# Patient Record
Sex: Female | Born: 1960 | Race: White | Hispanic: No | Marital: Single | Smoking: Never smoker
Health system: Southern US, Community
[De-identification: ages and names within clinical notes are randomized; demographics above are authoritative.]

## PROBLEM LIST (undated history)

## (undated) DIAGNOSIS — K5790 Diverticulosis of intestine, part unspecified, without perforation or abscess without bleeding: Secondary | ICD-10-CM

## (undated) DIAGNOSIS — Z8619 Personal history of other infectious and parasitic diseases: Secondary | ICD-10-CM

## (undated) DIAGNOSIS — K219 Gastro-esophageal reflux disease without esophagitis: Secondary | ICD-10-CM

## (undated) DIAGNOSIS — Z789 Other specified health status: Secondary | ICD-10-CM

## (undated) DIAGNOSIS — T7840XA Allergy, unspecified, initial encounter: Secondary | ICD-10-CM

## (undated) DIAGNOSIS — E079 Disorder of thyroid, unspecified: Secondary | ICD-10-CM

## (undated) DIAGNOSIS — D219 Benign neoplasm of connective and other soft tissue, unspecified: Secondary | ICD-10-CM

## (undated) HISTORY — DX: Benign neoplasm of connective and other soft tissue, unspecified: D21.9

## (undated) HISTORY — DX: Other specified health status: Z78.9

## (undated) HISTORY — DX: Allergy, unspecified, initial encounter: T78.40XA

## (undated) HISTORY — DX: Disorder of thyroid, unspecified: E07.9

## (undated) HISTORY — DX: Diverticulosis of intestine, part unspecified, without perforation or abscess without bleeding: K57.90

## (undated) HISTORY — DX: Personal history of other infectious and parasitic diseases: Z86.19

---

## 2005-08-19 ENCOUNTER — Ambulatory Visit: Payer: Self-pay | Admitting: Internal Medicine

## 2005-08-30 ENCOUNTER — Ambulatory Visit: Payer: Self-pay | Admitting: Internal Medicine

## 2012-07-08 ENCOUNTER — Telehealth: Payer: Self-pay | Admitting: Internal Medicine

## 2012-07-08 NOTE — Telephone Encounter (Signed)
Left message for pt to call back  °

## 2012-07-08 NOTE — Telephone Encounter (Signed)
Pt had CT scan and office notes from Dr. Conley Rolls in [facility] sent for Dr. Marina Goodell to review. Pt is requesting a direct EGD but states she is having problems with chronic abdominal pain and reflux. Records placed on Dr. Lamar Sprinkles office for review. Dr. Marina Goodell will you agree to see this pt? Please advise.

## 2012-07-09 NOTE — Telephone Encounter (Signed)
Need to have her see an extender to assess her problems/complaints,and review outside records, and decide if EGD appropriate

## 2012-07-09 NOTE — Telephone Encounter (Signed)
Pt aware. States she will call back tomorrow to schedule appt once she looks at her work schedule.

## 2012-07-10 NOTE — Telephone Encounter (Signed)
Pt states she had to work last night and she will call back Monday to schedule.

## 2012-07-13 ENCOUNTER — Telehealth: Payer: Self-pay | Admitting: Internal Medicine

## 2012-07-13 NOTE — Telephone Encounter (Signed)
Pt wants appt with [facility] later next week. Will call back Wed to see if schedule is out.

## 2012-07-13 NOTE — Telephone Encounter (Signed)
Forward  6 pages from [facility] to Dr. Yancey Flemings for review on 07-13-12 ym

## 2012-07-17 NOTE — Telephone Encounter (Signed)
Scheduled pt to see Amy Esterwood PA 07/22/12@3 :30pm. Pt aware of appt date and time.

## 2012-07-22 ENCOUNTER — Encounter: Payer: Self-pay | Admitting: Physician Assistant

## 2012-07-22 ENCOUNTER — Ambulatory Visit (INDEPENDENT_AMBULATORY_CARE_PROVIDER_SITE_OTHER): Payer: Commercial Managed Care - PPO | Admitting: Physician Assistant

## 2012-07-22 VITALS — BP 140/84 | HR 82 | Ht 67.0 in | Wt 162.4 lb

## 2012-07-22 DIAGNOSIS — K5790 Diverticulosis of intestine, part unspecified, without perforation or abscess without bleeding: Secondary | ICD-10-CM

## 2012-07-22 DIAGNOSIS — K5730 Diverticulosis of large intestine without perforation or abscess without bleeding: Secondary | ICD-10-CM

## 2012-07-22 DIAGNOSIS — R1013 Epigastric pain: Secondary | ICD-10-CM

## 2012-07-22 DIAGNOSIS — K219 Gastro-esophageal reflux disease without esophagitis: Secondary | ICD-10-CM

## 2012-07-22 NOTE — Patient Instructions (Addendum)
Stay on Prilosec, one tab 30 min before breakfast. You have been scheduled for an endoscopy with propofol. Please follow written instructions given to you at your visit today. If you use inhalers (even only as needed) or a CPAP machine, please bring them with you on the day of your procedure.

## 2012-07-22 NOTE — Progress Notes (Signed)
Subjective:    Patient ID: Autumn Wall, female    DOB: June 25, 1961, 51 y.o.   MRN: [id number]  HPI Autumn Wall is a pleasant 51 year old white female known remotely to Dr. Marina Goodell from prior colonoscopy this was done in January of 2007 and showed mild left-sided diverticulosis and external hemorrhoids. She comes in today with complaints of epigastric and left upper quadrant pain which has been present over the past year . She was seen by a GI group in [place] ( Dr. Conley Rolls) in August of 2013 with the same complaint, and underwent CT scan of the abdomen and pelvis which was negative she had stool throughout the visualized colon and a small hiatal hernia. She was to go back for upper endoscopy but has not had that done. She has been on Prilosec 20 mg by mouth daily over the past several years for what she describes as reflux and heartburn. She says she does well as long as she stays on the Prilosec but her symptoms always return if she stops it. She is no complaint of dysphagia or odynophagia she has not been taking any regular aspirin or NSAIDs. Her appetite has been good her weight has been stable she has not noticed any changes in her bowel habits nor any melena or hematochezia. She did feel that her pain has been aggravated by caffeine which she has decreased significantly and also at times by stress. She describes it as a dull ache which is annoying and fairly constant ,sometimes aggravated by  mouth intake. She says she has had baseline labs done through her primary care provider within the past several months and everything was normal .    Review of Systems  Constitutional: Negative.   HENT: Negative.   Respiratory: Negative.   Cardiovascular: Negative.   Gastrointestinal: Positive for abdominal pain.  Genitourinary: Negative.   Musculoskeletal: Negative.   Neurological: Negative.   Hematological: Negative.   Psychiatric/Behavioral: Negative.    Outpatient Encounter Prescriptions as of  07/22/2012  Medication Sig Dispense Refill  . Bioflavonoid Products (VITAMIN C) CHEW Chew 1 tablet by mouth daily.      . Multiple Vitamin (MULTIVITAMIN) tablet Take 1 tablet by mouth daily.      Marland Kitchen omeprazole (PRILOSEC) 20 MG capsule Take 20 mg by mouth daily.        NKDA Patient Active Problem List  Diagnosis  . GERD (gastroesophageal reflux disease)  . Diverticulosis   History  Substance Use Topics  . Smoking status: Never Smoker   . Smokeless tobacco: Never Used  . Alcohol Use: Not on file     Comment: rarely   Family History  Problem Relation Age of Onset  . Heart disease Mother   . Brain cancer Mother     tumor  . Atrial fibrillation Father      Objective:   Physical Exam well-developed white female in no acute distress, pleasant blood pressure 140/84 pulse 82 height 5 foot 7 weight 162. HEENT; nontraumatic normocephalic EOMI PERRLA sclera anicteric, Neck; supple no JVD, Cardiovascular; regular rate and rhythm with S1-S2 no murmur or gallop, Pulmonary; clear bilaterally, Abdomen; soft basically nontender nondistended there is no palpable mass or hepatosplenomegaly no chest wall or costal margin tenderness, bowel sounds are active, Rectal ;not done, Extremities; no clubbing cyanosis or edema skin warm and dry, Psych; mood and affect normal and appropriate        Assessment & Plan:  #52 51 year old female with chronic GERD and complaints of epigastric and  left upper quadrant pain x1 year persistent but not progressive negative CT scan August 2013. Will need to rule out gastritis ,peptic ulcer disease, H. Pylori, nonulcer dyspepsia.  #2 diverticulosis  Plan; we'll obtain copies of her labs from her primary care doctor Continue Prilosec 20 mg by mouth daily for now Pedal for upper endoscopy with Dr. Garner Gavel was discussed in detail with the patient and she is agreeable to proceed

## 2012-07-23 NOTE — Progress Notes (Signed)
Agree with initial assessment and plans 

## 2012-08-24 ENCOUNTER — Ambulatory Visit (AMBULATORY_SURGERY_CENTER): Payer: Commercial Managed Care - PPO | Admitting: Internal Medicine

## 2012-08-24 ENCOUNTER — Encounter: Payer: Self-pay | Admitting: Internal Medicine

## 2012-08-24 VITALS — BP 134/92 | HR 73 | Temp 97.9°F | Resp 14 | Ht 67.0 in | Wt 162.0 lb

## 2012-08-24 DIAGNOSIS — R1013 Epigastric pain: Secondary | ICD-10-CM

## 2012-08-24 DIAGNOSIS — K219 Gastro-esophageal reflux disease without esophagitis: Secondary | ICD-10-CM

## 2012-08-24 MED ORDER — ESOMEPRAZOLE MAGNESIUM 40 MG PO CPDR: 40.0000 mg | DELAYED_RELEASE_CAPSULE | Freq: Every day | ORAL | Status: DC

## 2012-08-24 MED ORDER — SODIUM CHLORIDE 0.9 % IV SOLN: 500.0000 mL | INTRAVENOUS | Status: DC

## 2012-08-24 NOTE — Op Note (Signed)
[facility] [address] [place], [address]   ENDOSCOPY PROCEDURE REPORT  PATIENT: Shauna, Bodkins.  MR#: [id number] BIRTHDATE: [birth year] , 51  yrs. old GENDER: Female ENDOSCOPIST: Roxy Cedar, MD REFERRED BY:  .  Self / Office PROCEDURE DATE:  08/24/2012 PROCEDURE:  EGD, diagnostic ASA CLASS:     Class I INDICATIONS:  abdominal pain in upper left quadrant / epigastric. Greater than one year off and on. MEDICATIONS: MAC sedation, administered by CRNA and propofol (Diprivan) 200mg  IV TOPICAL ANESTHETIC: Cetacaine Spray  DESCRIPTION OF PROCEDURE: After the risks benefits and alternatives of the procedure were thoroughly explained, informed consent was obtained.  The LB GIF-H180 [id number] endoscope was introduced through the mouth and advanced to the third portion of the duodenum. Without limitations.  The instrument was slowly withdrawn as the mucosa was fully examined.      The upper, middle and distal third of the esophagus were carefully inspected and no abnormalities were noted.  The z-line was well seen at the GEJ.  The endoscope was pushed into the fundus which was normal including a retroflexed view.  The antrum, gastric body, first and second part of the duodenum were unremarkable. Retroflexed views revealed no abnormalities.     The scope was then withdrawn from the patient and the procedure completed.  COMPLICATIONS: There were no complications. ENDOSCOPIC IMPRESSION: 1. Normal EGD 2. GERD 3. No GI cause for chronic upper abdominal pain found  RECOMMENDATIONS: 1.  Anti-reflux regimen to be followed 2.  Continue PPI (omeprazole) for GERD  REPEAT EXAM:  eSigned:  Roxy Cedar, MD 08/24/2012 4:08 PM   CC:The Patient

## 2012-08-24 NOTE — Patient Instructions (Addendum)

## 2012-08-24 NOTE — Progress Notes (Signed)
Patient did not experience any of the following events: a burn prior to discharge; a fall within the facility; wrong site/side/patient/procedure/implant event; or a hospital transfer or hospital admission upon discharge from the facility. (G8907) Patient did not have preoperative order for IV antibiotic SSI prophylaxis. (G8918)  

## 2012-08-25 ENCOUNTER — Telehealth: Payer: Self-pay | Admitting: *Deleted

## 2012-08-25 NOTE — Telephone Encounter (Signed)
Left message on f/u callback 

## 2012-08-26 ENCOUNTER — Telehealth: Payer: Self-pay

## 2012-08-26 NOTE — Telephone Encounter (Signed)
Filled out prior authorization form [employer].  Will wait for answer in order to refill Nexium

## 2012-08-31 ENCOUNTER — Telehealth: Payer: Self-pay | Admitting: Internal Medicine

## 2012-09-01 ENCOUNTER — Telehealth: Payer: Self-pay

## 2012-09-01 NOTE — Telephone Encounter (Signed)
Re-faxed prior authorization for Nexium

## 2012-09-02 ENCOUNTER — Telehealth: Payer: Self-pay

## 2012-09-02 NOTE — Telephone Encounter (Signed)
Left message

## 2012-09-02 NOTE — Telephone Encounter (Signed)
Erroneous encounter

## 2012-09-02 NOTE — Telephone Encounter (Signed)
Discussed alternatives to Nexium with patient.  She has already tried [employer] and Prilosec with mediocre results.  She is going to call her insurance company to see if Dexilant is covered and let me know

## 2015-09-27 ENCOUNTER — Encounter: Payer: Self-pay | Admitting: Internal Medicine

## 2015-09-27 ENCOUNTER — Emergency Department (HOSPITAL_COMMUNITY): Payer: Commercial Managed Care - PPO

## 2015-09-27 ENCOUNTER — Emergency Department (HOSPITAL_COMMUNITY)
Admission: EM | Admit: 2015-09-27 | Discharge: 2015-09-27 | Disposition: A | Discharge status: Home / Self Care | Payer: Commercial Managed Care - PPO | Attending: Emergency Medicine | Admitting: Emergency Medicine

## 2015-09-27 ENCOUNTER — Encounter (HOSPITAL_COMMUNITY): Payer: Self-pay | Admitting: Cardiology

## 2015-09-27 DIAGNOSIS — R51 Headache: Secondary | ICD-10-CM | POA: Diagnosis present

## 2015-09-27 DIAGNOSIS — G44209 Tension-type headache, unspecified, not intractable: Secondary | ICD-10-CM | POA: Diagnosis not present

## 2015-09-27 DIAGNOSIS — K219 Gastro-esophageal reflux disease without esophagitis: Secondary | ICD-10-CM | POA: Insufficient documentation

## 2015-09-27 DIAGNOSIS — Z79899 Other long term (current) drug therapy: Secondary | ICD-10-CM | POA: Insufficient documentation

## 2015-09-27 HISTORY — DX: Gastro-esophageal reflux disease without esophagitis: K21.9

## 2015-09-27 MED ORDER — ISOMETHEPTENE-DICHLORAL-APAP 65-100-325 MG PO CAPS: 1.0000 | ORAL_CAPSULE | Freq: Four times a day (QID) | ORAL | Status: DC | PRN

## 2015-09-27 NOTE — ED Provider Notes (Signed)
CSN: [id number]     Arrival date & time 09/27/15  1113 History   First MD Initiated Contact with Patient 09/27/15 1715     Chief Complaint  Patient presents with  . Headache     (Consider location/radiation/quality/duration/timing/severity/associated sxs/prior Treatment) Patient is a 55 y.o. female presenting with headaches. The history is provided by the patient. No language interpreter was used.  Headache Pain location:  Generalized Radiates to:  Does not radiate Severity currently:  7/10 Severity at highest:  7/10 Onset quality:  Gradual Timing:  Constant Progression:  Worsening Chronicity:  New Similar to prior headaches: yes   Context: not activity   Ineffective treatments:  None tried Associated symptoms: no abdominal pain and no facial pain   Risk factors: no anger   Pt reports she hit her head almost 2 years ago.  Pt has had headaches on and off since.  Pt reports headaches are becoming more constant.  Pt reports she feels like she has s knot inside her head.  Past Medical History  Diagnosis Date  . GERD (gastroesophageal reflux disease)    History reviewed. No pertinent past surgical history. Family History  Problem Relation Age of Onset  . Heart disease Mother   . Brain cancer Mother     tumor  . Atrial fibrillation Father    Social History  Substance Use Topics  . Smoking status: Never Smoker   . Smokeless tobacco: Never Used  . Alcohol Use: No     Comment: rarely   OB History    No data available     Review of Systems  Gastrointestinal: Negative for abdominal pain.  Neurological: Positive for headaches.  All other systems reviewed and are negative.     Allergies  Review of patient's allergies indicates no known allergies.  Home Medications   Prior to Admission medications   Medication Sig Start Date End Date Taking? Authorizing Provider  Bioflavonoid Products (VITAMIN C) CHEW Chew 1 tablet by mouth daily.    Historical Provider, MD   esomeprazole (NEXIUM) 40 MG capsule Take 1 capsule (40 mg total) by mouth daily before breakfast. 08/24/12   Irene Shipper, MD  Multiple Vitamin (MULTIVITAMIN) tablet Take 1 tablet by mouth daily.    Historical Provider, MD   BP 149/87 mmHg  Pulse 67  Temp(Src) 97.8 F (36.6 C) (Oral)  Resp 20  Wt 73.483 kg  SpO2 100%  LMP 08/10/2012 Physical Exam  Constitutional: She is oriented to person, place, and time. She appears well-developed and well-nourished.  HENT:  Head: Normocephalic and atraumatic.  Right Ear: External ear normal.  Left Ear: External ear normal.  Nose: Nose normal.  Mouth/Throat: Oropharynx is clear and moist.  Eyes: EOM are normal. Pupils are equal, round, and reactive to light.  Neck: Normal range of motion.  Cardiovascular: Normal rate and normal heart sounds.   Pulmonary/Chest: Effort normal.  Abdominal: Soft. She exhibits no distension.  Musculoskeletal: Normal range of motion.  Neurological: She is alert and oriented to person, place, and time.  Psychiatric: She has a normal mood and affect.  Nursing note and vitals reviewed.   ED Course  Procedures (including critical care time) Labs Review Labs Reviewed - No data to display  Imaging Review Ct Head Wo Contrast  09/27/2015  CLINICAL DATA:  Chronic, constant headache.  Initial encounter. EXAM: CT HEAD WITHOUT CONTRAST TECHNIQUE: Contiguous axial images were obtained from the base of the skull through the vertex without intravenous contrast. COMPARISON:  None.  FINDINGS: There is no evidence of acute intracranial abnormality including hemorrhage, infarct, mass lesion, mass effect, midline shift or abnormal extra-axial fluid collection. 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 Imaged paranasal sinuses and mastoid air cells are clear. IMPRESSION: Negative head CT. Electronically Signed   By: Inge Rise M.D.    On: 09/27/2015 18:22   I have personally reviewed and evaluated these images and lab results as part of my medical decision-making.   EKG Interpretation None      MDM   Final diagnoses:  Tension-type headache, not intractable, unspecified chronicity pattern   Meds ordered this encounter  Medications  . isometheptene-acetaminophen-dichloralphenazone (MIDRIN) 65-100-325 MG capsule    Sig: Take 1 capsule by mouth 4 (four) times daily as needed for migraine. Maximum 5 capsules in 12 hours for migraine headaches, 8 capsules in 24 hours for tension headaches.    Dispense:  30 capsule    Refill:  0    Order Specific Question:  Supervising Provider    Answer:  Noemi Chapel [[id number]]   An After Visit Summary was printed and given to the patient.    [place], PA-C 09/27/15 [name], MD 09/29/15 0040

## 2015-09-27 NOTE — Discharge Instructions (Signed)

## 2015-09-27 NOTE — ED Notes (Signed)
Pt reports she had a concussion at work back in 2015. Has been having pain since then and states she never had a CT scan. Denies any vision changes.

## 2017-02-18 DIAGNOSIS — H60311 Diffuse otitis externa, right ear: Secondary | ICD-10-CM | POA: Insufficient documentation

## 2017-03-06 IMAGING — CT CT HEAD W/O CM
2 series · 15 of 30 positions shown, 17 images · non-contrast
Comparison: None.

CLINICAL DATA: Chronic, constant headache.  Initial encounter.

EXAM:
CT HEAD WITHOUT CONTRAST
TECHNIQUE: Contiguous axial images were obtained from the base of the skull
through the vertex without intravenous contrast.

[Series 2: head without · axial · non-contrast · 0.38mm/px · z∈[-37,+83]mm · 7 of 32 slices shown, 9 images]
[im 4/32  brain]
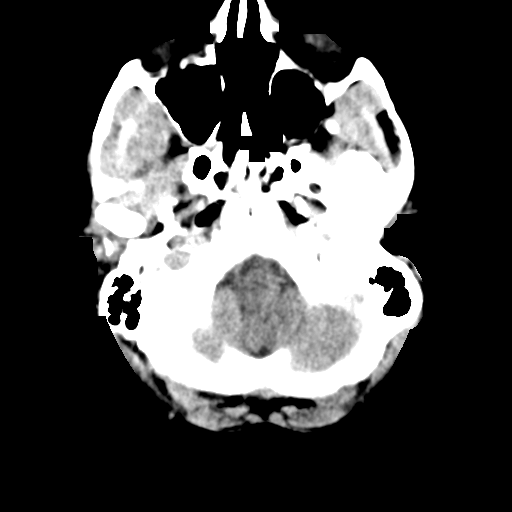
[im 4/32  bone]
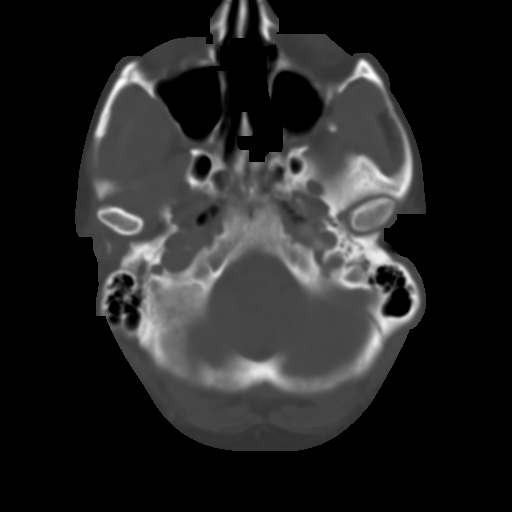
[im 8/32  brain]
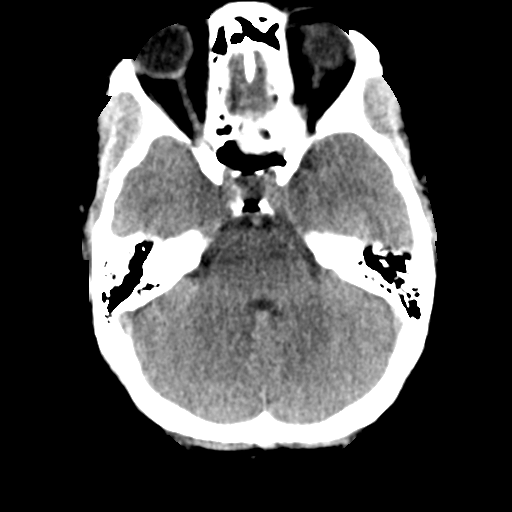
[im 12/32  brain]
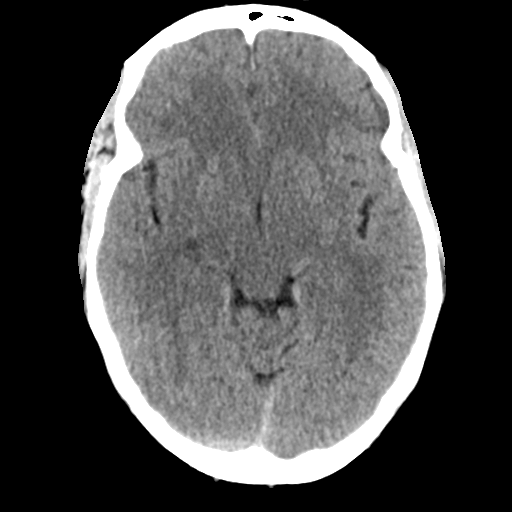
[im 16/32  brain]
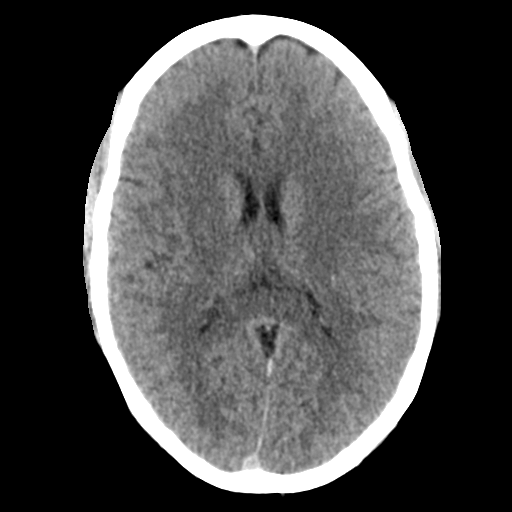
[im 20/32  brain]
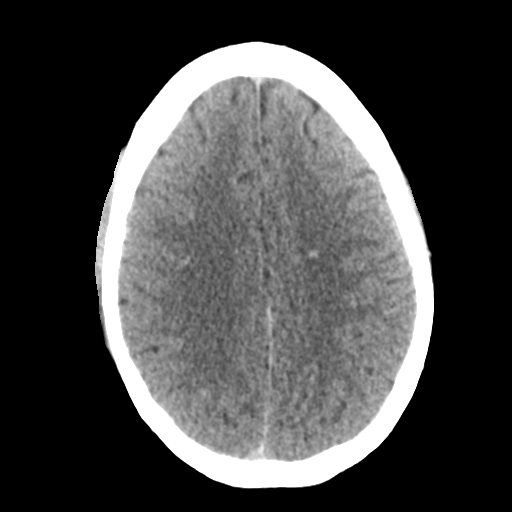
[im 20/32  bone]
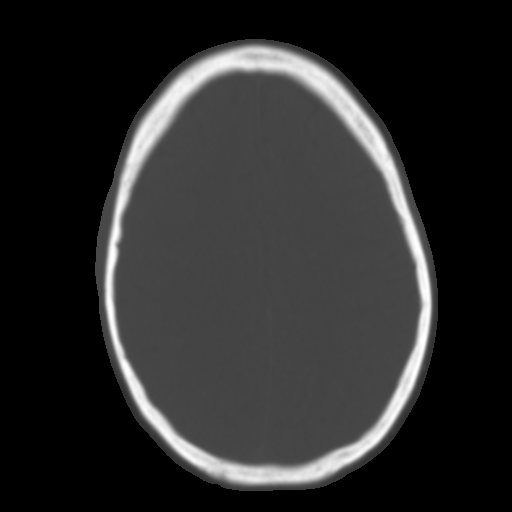
[im 24/32  brain]
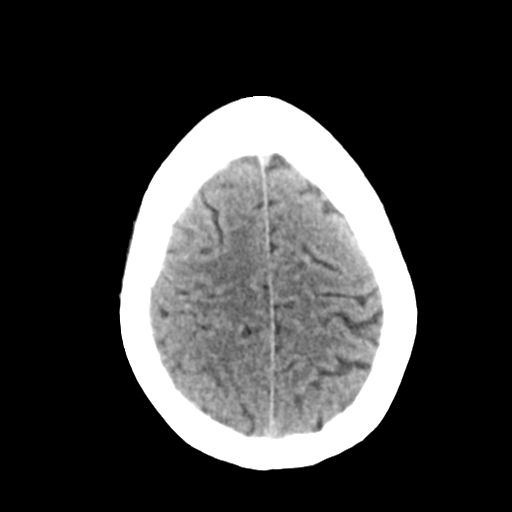
[im 28/32  brain]
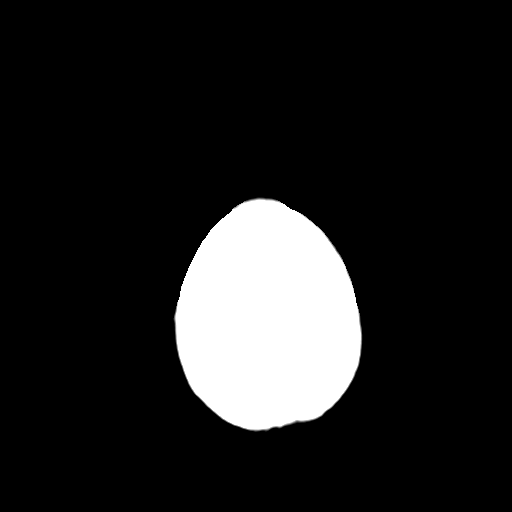

[Series 3: head bone · axial · 0.38mm/px · z∈[-38,+86]mm · 8 of 78 slices shown]
[im 8/78  bone]
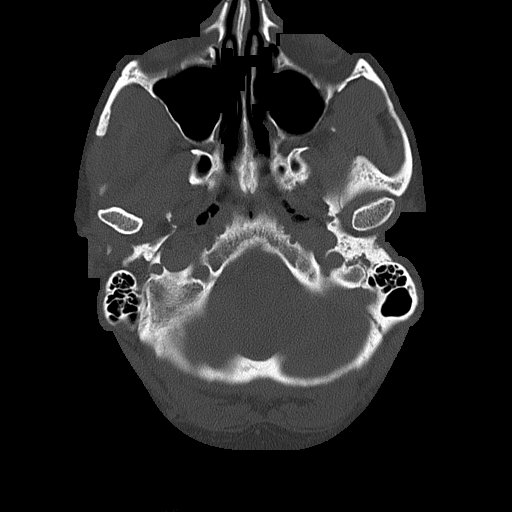
[im 16/78  bone]
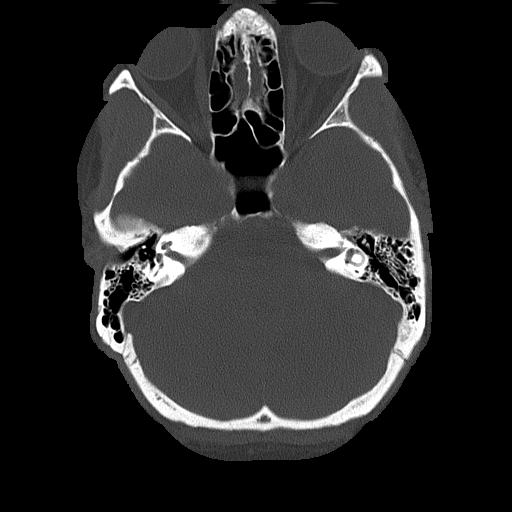
[im 24/78  bone]
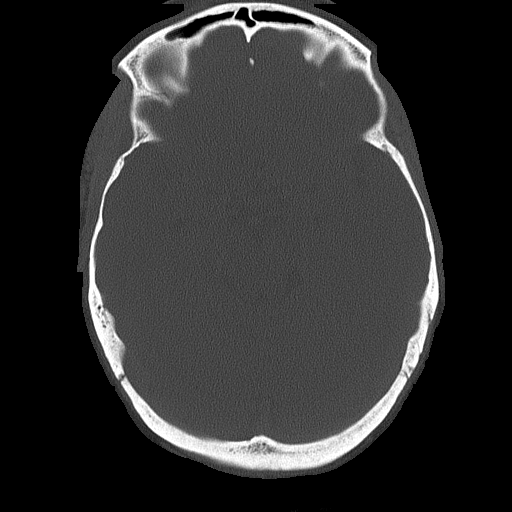
[im 35/78  bone]
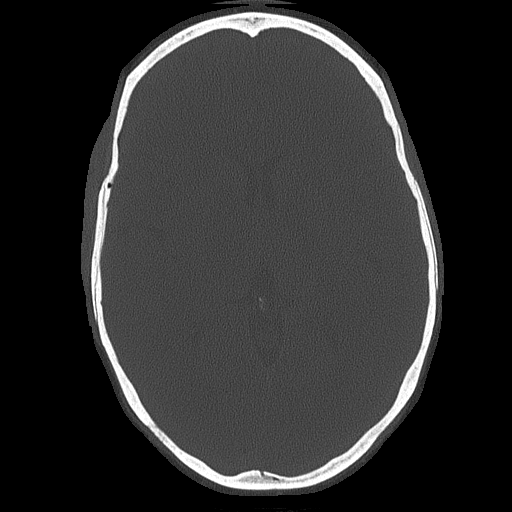
[im 43/78  bone]
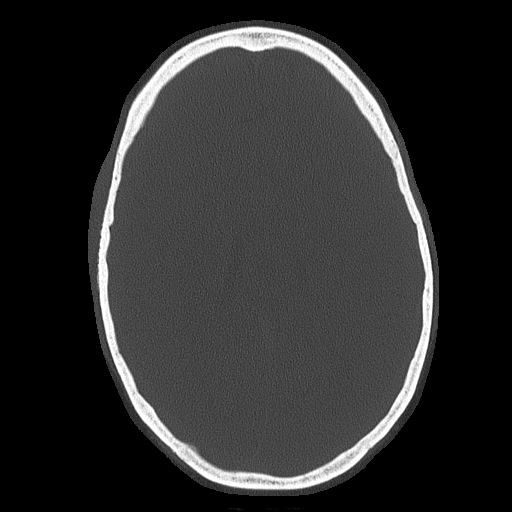
[im 54/78  bone]
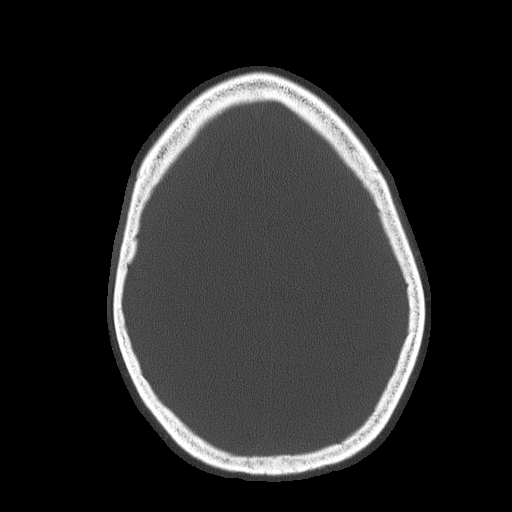
[im 62/78  bone]
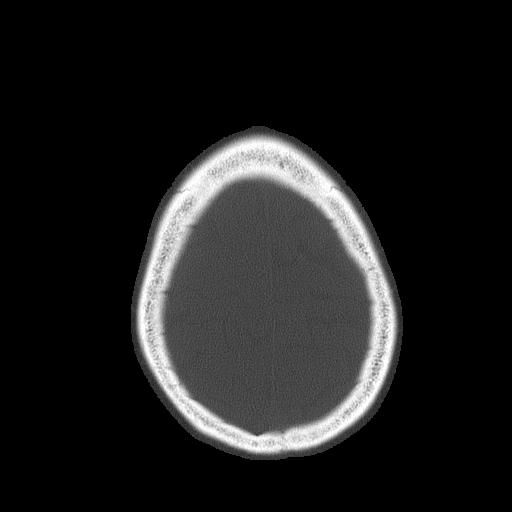
[im 70/78  bone]
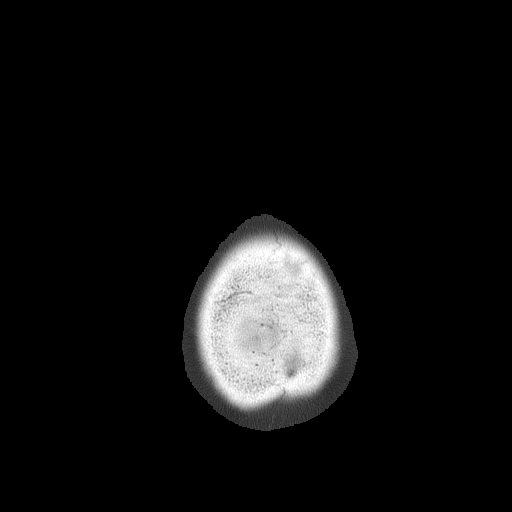

[15 of 30 positions shown; findings below may reference images not displayed]

FINDINGS: There is no evidence of acute intracranial abnormality including
hemorrhage, infarct, mass lesion, mass effect, midline shift or
abnormal extra-axial fluid collection. Somewhat prominent venous
anomaly extending from the left basal ganglia from the sulci to the
high left frontal lobe is incidentally noted. Imaged paranasal
sinuses and mastoid air cells are clear.
IMPRESSION: Negative head CT.

## 2017-04-14 ENCOUNTER — Other Ambulatory Visit: Payer: Self-pay

## 2017-04-16 ENCOUNTER — Other Ambulatory Visit: Payer: Self-pay | Admitting: *Deleted

## 2017-04-16 ENCOUNTER — Other Ambulatory Visit: Payer: Self-pay | Admitting: Emergency Medicine

## 2017-04-16 ENCOUNTER — Other Ambulatory Visit: Payer: Self-pay | Admitting: Physician Assistant

## 2017-04-16 DIAGNOSIS — K5730 Diverticulosis of large intestine without perforation or abscess without bleeding: Secondary | ICD-10-CM

## 2017-04-17 ENCOUNTER — Other Ambulatory Visit: Payer: Self-pay | Admitting: Physician Assistant

## 2017-04-17 DIAGNOSIS — N644 Mastodynia: Secondary | ICD-10-CM

## 2017-04-17 DIAGNOSIS — M79622 Pain in left upper arm: Secondary | ICD-10-CM

## 2017-04-22 ENCOUNTER — Ambulatory Visit
Admission: RE | Admit: 2017-04-22 | Discharge: 2017-04-22 | Disposition: A | Discharge status: Home / Self Care | Payer: Commercial Managed Care - PPO | Source: Ambulatory Visit | Attending: Physician Assistant | Admitting: Physician Assistant

## 2017-04-22 ENCOUNTER — Ambulatory Visit: Payer: Commercial Managed Care - PPO

## 2017-04-22 DIAGNOSIS — N644 Mastodynia: Secondary | ICD-10-CM

## 2017-04-22 DIAGNOSIS — M79622 Pain in left upper arm: Secondary | ICD-10-CM

## 2017-05-16 ENCOUNTER — Other Ambulatory Visit: Payer: Self-pay | Admitting: Physician Assistant

## 2017-05-16 ENCOUNTER — Ambulatory Visit
Admission: RE | Admit: 2017-05-16 | Discharge: 2017-05-16 | Disposition: A | Discharge status: Home / Self Care | Payer: PRIVATE HEALTH INSURANCE | Source: Ambulatory Visit | Attending: Physician Assistant | Admitting: Physician Assistant

## 2017-05-16 DIAGNOSIS — K5730 Diverticulosis of large intestine without perforation or abscess without bleeding: Secondary | ICD-10-CM

## 2018-01-29 ENCOUNTER — Ambulatory Visit (HOSPITAL_COMMUNITY)
Admission: EM | Admit: 2018-01-29 | Discharge: 2018-01-29 | Disposition: A | Discharge status: Home / Self Care | Payer: 59 | Attending: Family Medicine | Admitting: Family Medicine

## 2018-01-29 ENCOUNTER — Encounter (HOSPITAL_COMMUNITY): Payer: Self-pay | Admitting: Emergency Medicine

## 2018-01-29 DIAGNOSIS — L989 Disorder of the skin and subcutaneous tissue, unspecified: Secondary | ICD-10-CM

## 2018-01-29 NOTE — ED Triage Notes (Signed)
Pt states she has a darkened spot on her cheek she wants checked out x3 months

## 2018-01-29 NOTE — ED Provider Notes (Signed)
[facility]   [id number] 01/29/18 Arrival Time: 8341  ASSESSMENT & PLAN:  1. Facial skin lesion    Unclear of exact cause of facial lesion. Recommended that she keep her appt with dermatology and f/u here as needed.  Reviewed expectations re: course of current medical issues. Questions answered. Outlined signs and symptoms indicating need for more acute intervention. Patient verbalized understanding. After Visit Summary given.   SUBJECTIVE:  Autumn Wall is a 57 y.o. female who presents with a skin complaint.   Location: R cheek; 'a dark spot' Onset: gradual Duration: a few months Pruritic? No Painful? No Progression: stable  Drainage? No  Known trigger? No  New soaps/lotions/topicals/detergents? No Environmental exposures or allergies? none Contacts with similar? No Recent travel? No  Other associated symptoms: none Therapies tried thus far: none Denies fever. No specific aggravating or alleviating factors reported.  Has been referred by a provider to dermatology. Has not gone yet.  ROS: As per HPI.  OBJECTIVE: Vitals:   01/29/18 1537  BP: (!) 162/102  Pulse: 71  Resp: 18  Temp: 98.7 F (37.1 C)  SpO2: 95%    General appearance: alert; no distress Lungs: clear to auscultation bilaterally Heart: regular rate and rhythm Extremities: no edema Skin: warm and dry; R cheek with approx 1cm area of mild darkening with irregular borders; slightly raised; non-tender; no bleeding or drainage Psychological: alert and cooperative; normal mood and affect  Allergies  Allergen Reactions  . Isovue [Iopamidol] Shortness Of Breath and Other (See Comments)    Pt states she had IV contrast 5 yrs ago and had SOB and her throat got very tight for about 30 secs.  We did w/o contrast on 05/16/17.  Pt needs full premeds in the future.  Alfonse Alpers, RTRCT    Past Medical History:  Diagnosis Date  . GERD (gastroesophageal reflux disease)    Social History    Socioeconomic History  . Marital status: Single    Spouse name: Not on file  . Number of children: Not on file  . Years of education: Not on file  . Highest education level: Not on file  Occupational History  . Occupation: nurse  Social Needs  . Financial resource strain: Not on file  . Food insecurity:    Worry: Not on file    Inability: Not on file  . Transportation needs:    Medical: Not on file    Non-medical: Not on file  Tobacco Use  . Smoking status: Never Smoker  . Smokeless tobacco: Never Used  Substance and Sexual Activity  . Alcohol use: No    Comment: rarely  . Drug use: No  . Sexual activity: Not on file  Lifestyle  . Physical activity:    Days per week: Not on file    Minutes per session: Not on file  . Stress: Not on file  Relationships  . Social connections:    Talks on phone: Not on file    Gets together: Not on file    Attends religious service: Not on file    Active member of club or organization: Not on file    Attends meetings of clubs or organizations: Not on file    Relationship status: Not on file  . Intimate partner violence:    Fear of current or ex partner: Not on file    Emotionally abused: Not on file    Physically abused: Not on file    Forced sexual activity: Not on file  Other Topics Concern  . Not on file  Social History Narrative  . Not on file   Family History  Problem Relation Age of Onset  . Heart disease Mother   . Brain cancer Mother        tumor  . Atrial fibrillation Father    History reviewed. No pertinent surgical history.   Vanessa Kick, MD 02/11/18 1015

## 2018-05-05 ENCOUNTER — Other Ambulatory Visit: Payer: Self-pay

## 2018-05-05 ENCOUNTER — Emergency Department (HOSPITAL_BASED_OUTPATIENT_CLINIC_OR_DEPARTMENT_OTHER)
Admission: EM | Admit: 2018-05-05 | Discharge: 2018-05-05 | Disposition: A | Discharge status: Home / Self Care | Payer: 59 | Attending: Emergency Medicine | Admitting: Emergency Medicine

## 2018-05-05 ENCOUNTER — Encounter (HOSPITAL_BASED_OUTPATIENT_CLINIC_OR_DEPARTMENT_OTHER): Payer: Self-pay | Admitting: *Deleted

## 2018-05-05 DIAGNOSIS — Y999 Unspecified external cause status: Secondary | ICD-10-CM | POA: Insufficient documentation

## 2018-05-05 DIAGNOSIS — X509XXA Other and unspecified overexertion or strenuous movements or postures, initial encounter: Secondary | ICD-10-CM | POA: Diagnosis not present

## 2018-05-05 DIAGNOSIS — Y929 Unspecified place or not applicable: Secondary | ICD-10-CM | POA: Insufficient documentation

## 2018-05-05 DIAGNOSIS — Z79899 Other long term (current) drug therapy: Secondary | ICD-10-CM | POA: Insufficient documentation

## 2018-05-05 DIAGNOSIS — S76311A Strain of muscle, fascia and tendon of the posterior muscle group at thigh level, right thigh, initial encounter: Secondary | ICD-10-CM | POA: Diagnosis not present

## 2018-05-05 DIAGNOSIS — Y9301 Activity, walking, marching and hiking: Secondary | ICD-10-CM | POA: Insufficient documentation

## 2018-05-05 DIAGNOSIS — S79921A Unspecified injury of right thigh, initial encounter: Secondary | ICD-10-CM | POA: Diagnosis present

## 2018-05-05 MED ORDER — METHOCARBAMOL 500 MG PO TABS: 500.0000 mg | ORAL_TABLET | Freq: Two times a day (BID) | ORAL | 0 refills | Status: DC

## 2018-05-05 MED ORDER — IBUPROFEN 600 MG PO TABS: 600.0000 mg | ORAL_TABLET | Freq: Four times a day (QID) | ORAL | 0 refills | Status: DC | PRN

## 2018-05-05 NOTE — Discharge Instructions (Signed)
Your pain is likely due to hamstring strain/tear.  Please take ibuprofen and robaxin as prescribed.  Try Epsom salt bath daily.  Call and follow up with orthopedist for further management.

## 2018-05-05 NOTE — ED Triage Notes (Signed)
Pt c/o fall standing 9/24, c/o right knee and thigh pain

## 2018-05-05 NOTE — ED Notes (Signed)
ED Provider at bedside. 

## 2018-05-05 NOTE — ED Notes (Signed)
Pt/family verbalized understanding of discharge instructions.   

## 2018-05-05 NOTE — ED Provider Notes (Signed)
[facility] Provider Note   CSN: [id number] Arrival date & time: 05/05/18  Autumn Wall     History   Chief Complaint Chief Complaint  Patient presents with  . Fall    HPI Autumn Wall is a 57 y.o. female.  The history is provided by the patient. No language interpreter was used.  Fall  Pertinent negatives include no headaches.     57 year old female presenting for evaluation of right leg pain.  Patient report on 9/24, she was at home trying to put away some bags, and her leg slipped, she hyperextended her right leg and nearly fell down to the ground.  She did struck her face against the cabinet but denies any loss of consciousness.  She did notice bruising around her right eye which has improved.  She noted significant bruising to the back of her right thigh in which she described as a crampy tightness sensation, with muscle spasm causing her to have increasing difficulty sleeping at nighttime.  Pain is worse with movement.  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  She does not think she has any broken bone but she is frustrated with the muscle spasm and cramping.  She has been taking Tylenol at home without adequate relief.  She denies any associated numbness.  She denies any significant back pain.  She did complain of some soreness in her right knee but none in her right ankle.  Past Medical History:  Diagnosis Date  . GERD (gastroesophageal reflux disease)     Patient Active Problem List   Diagnosis Date Noted  . GERD (gastroesophageal reflux disease) 07/22/2012  . Diverticulosis 07/22/2012    History reviewed. No pertinent surgical history.   OB History   None      Home Medications    Prior to Admission medications   Medication Sig Start Date End Date Taking? Authorizing Provider  Bioflavonoid Products (VITAMIN C) CHEW Chew 1 tablet by mouth daily.    [provider]  esomeprazole (NEXIUM)  40 MG capsule Take 1 capsule (40 mg total) by mouth daily before breakfast. 08/24/12   Irene Shipper, MD  isometheptene-acetaminophen-dichloralphenazone (MIDRIN) [phone] MG capsule Take 1 capsule by mouth 4 (four) times daily as needed for migraine. Maximum 5 capsules in 12 hours for migraine headaches, 8 capsules in 24 hours for tension headaches. 09/27/15   Fransico Meadow, PA-C  Multiple Vitamin (MULTIVITAMIN) tablet Take 1 tablet by mouth daily.    [provider]    Family History Family History  Problem Relation Age of Onset  . Heart disease Mother   . Brain cancer Mother        tumor  . Atrial fibrillation Father     Social History Social History   Tobacco Use  . Smoking status: Never Smoker  . Smokeless tobacco: Never Used  Substance Use Topics  . Alcohol use: No    Comment: rarely  . Drug use: No     Allergies   Isovue [iopamidol]   Review of Systems Review of Systems  Constitutional: Negative for fever.  Skin: Negative for wound.  Neurological: Negative for headaches.  All other systems reviewed and are negative.    Physical Exam Updated Vital Signs BP (!) 150/100   Pulse 79   Temp 98.4 F (36.9 C)   Resp 18   Ht 5\' 7"  (1.702 m)   Wt 81.6 kg   LMP 08/10/2012  SpO2 99%   BMI 28.19 kg/m   Physical Exam  Constitutional: She appears well-developed and well-nourished. No distress.  HENT:  Head: Atraumatic.  Eyes: Conjunctivae are normal.  Faint ecchymosis noted to right orbital region with minimal tenderness to palpation.  Extraocular movements intact pupils equal reactive  Neck: Normal range of motion. Neck supple.  Cardiovascular: Intact distal pulses.  Musculoskeletal: She exhibits tenderness (Right thigh: Large ecchymosis noted to the posterior thigh mildly tender to palpation.  Normal hip flexion extension abduction abduction.  Normal knee flexion and extension.  No deformity.).  Neurological: She is alert.  Skin: No rash noted.    Psychiatric: She has a normal mood and affect.  Nursing note and vitals reviewed.    ED Treatments / Results  Labs (all labs ordered are listed, but only abnormal results are displayed) Labs Reviewed - No data to display  EKG None  Radiology No results found.  Procedures Procedures (including critical care time)  Medications Ordered in ED Medications - No data to display   Initial Impression / Assessment and Plan / ED Course  I have reviewed the triage vital signs and the nursing notes.  Pertinent labs & imaging results that were available during my care of the patient were reviewed by me and considered in my medical decision making (see chart for details).     BP (!) 150/100   Pulse 79   Temp 98.4 F (36.9 C)   Resp 18   Ht 5\' 7"  (1.702 m)   Wt 81.6 kg   LMP 08/10/2012   SpO2 99%   BMI 28.19 kg/m    Final Clinical Impressions(s) / ED Diagnoses   Final diagnoses:  Hamstring muscle strain, right, initial encounter    ED Discharge Orders         Ordered    methocarbamol (ROBAXIN) 500 MG tablet  2 times daily     05/05/18 1907    ibuprofen (ADVIL,MOTRIN) 600 MG tablet  Every 6 hours PRN     05/05/18 1907          7:05 PM Patient fell a week ago and developed a moderate amount of ecchymosis to the back of her right thigh.  It appears this is likely to be hematoma secondary to muscle tear as she hyperextended her right hip.  Pain primarily to the involving the hamstring.  She retains full range of motion throughout the hip and knee and does not want imaging at this time.  She request for muscle relaxant as well as work note as she is having difficulty moving about at work.  She has a ecchymosis to her right orbital region from the same fall but pain is minimal at this time and there is no restriction of eye movement.  Patient will be discharged home with Robaxin as well as work note.  Orthopedic referral given as needed.  Return precautions discussed. Pt  currently using a cane to ambulate.  She is NVI.    Domenic Moras, PA-C 05/05/18 1909    Tegeler, Gwenyth Allegra, MD 05/05/18 [phone]

## 2018-05-07 ENCOUNTER — Ambulatory Visit: Payer: 59 | Admitting: Family Medicine

## 2018-05-07 ENCOUNTER — Encounter: Payer: Self-pay | Admitting: Family Medicine

## 2018-05-07 VITALS — BP 166/100 | HR 75 | Ht 67.0 in | Wt 180.0 lb

## 2018-05-07 DIAGNOSIS — S76311A Strain of muscle, fascia and tendon of the posterior muscle group at thigh level, right thigh, initial encounter: Secondary | ICD-10-CM

## 2018-05-07 NOTE — Patient Instructions (Signed)
You have a hamstring strain. Wear ACE wrap or compression sleeve when up and walking around for next 6 weeks if tolerated. Meloxicam 7.5mg  daily with food for pain and inflammation. Robaxin as needed for spasms. Ice 15 minutes at a time 3-4 times a day. Leg curls, hamstring swings when tolerated 3 sets of 10 once a day. Consider physical therapy as well. Follow up with me in 2 weeks - out of work in meantime.

## 2018-05-09 ENCOUNTER — Encounter: Payer: Self-pay | Admitting: Family Medicine

## 2018-05-09 NOTE — Progress Notes (Signed)
PCP: Patient, No Pcp Per  Subjective:   HPI: Patient is a 57 y.o. female here for right hamstring injury.  Patient reports on 9/24 she was at home when she stepped on a plastic bag and did the splits with right leg extended behind her, struck face on a cabinet. Immediate pain posterior right thigh and difficulty bearing weight. Taking ibuprofen and robaxin. Pain level at 7/10 and sharp in posterior right thigh. Using cane to help with weight bearing. Associated bruising and swelling.  No numbness.  Past Medical History:  Diagnosis Date  . GERD (gastroesophageal reflux disease)     Current Outpatient Medications on File Prior to Visit  Medication Sig Dispense Refill  . Bioflavonoid Products (VITAMIN C) CHEW Chew 1 tablet by mouth daily.    Marland Kitchen esomeprazole (NEXIUM) 40 MG capsule Take 1 capsule (40 mg total) by mouth daily before breakfast. 30 capsule 11  . ibuprofen (ADVIL,MOTRIN) 600 MG tablet Take 1 tablet (600 mg total) by mouth every 6 (six) hours as needed. 30 tablet 0  . isometheptene-acetaminophen-dichloralphenazone (MIDRIN) 65-100-325 MG capsule Take 1 capsule by mouth 4 (four) times daily as needed for migraine. Maximum 5 capsules in 12 hours for migraine headaches, 8 capsules in 24 hours for tension headaches. 30 capsule 0  . methocarbamol (ROBAXIN) 500 MG tablet Take 1 tablet (500 mg total) by mouth 2 (two) times daily. 20 tablet 0  . Multiple Vitamin (MULTIVITAMIN) tablet Take 1 tablet by mouth daily.     No current facility-administered medications on file prior to visit.     History reviewed. No pertinent surgical history.  Allergies  Allergen Reactions  . Isovue [Iopamidol] Shortness Of Breath and Other (See Comments)    Pt states she had IV contrast 5 yrs ago and had SOB and her throat got very tight for about 30 secs.  We did w/o contrast on 05/16/17.  Pt needs full premeds in the future.  Alfonse Alpers, RTRCT    Social History   Socioeconomic History  . Marital  status: Single    Spouse name: Not on file  . Number of children: Not on file  . Years of education: Not on file  . Highest education level: Not on file  Occupational History  . Occupation: nurse  Social Needs  . Financial resource strain: Not on file  . Food insecurity:    Worry: Not on file    Inability: Not on file  . Transportation needs:    Medical: Not on file    Non-medical: Not on file  Tobacco Use  . Smoking status: Never Smoker  . Smokeless tobacco: Never Used  Substance and Sexual Activity  . Alcohol use: No    Comment: rarely  . Drug use: No  . Sexual activity: Not on file  Lifestyle  . Physical activity:    Days per week: Not on file    Minutes per session: Not on file  . Stress: Not on file  Relationships  . Social connections:    Talks on phone: Not on file    Gets together: Not on file    Attends religious service: Not on file    Active member of club or organization: Not on file    Attends meetings of clubs or organizations: Not on file    Relationship status: Not on file  . Intimate partner violence:    Fear of current or ex partner: Not on file    Emotionally abused: Not on file  Physically abused: Not on file    Forced sexual activity: Not on file  Other Topics Concern  . Not on file  Social History Narrative  . Not on file    Family History  Problem Relation Age of Onset  . Heart disease Mother   . Brain cancer Mother        tumor  . Atrial fibrillation Father     BP (!) 166/100   Pulse 75   Ht 5\' 7"  (1.702 m)   Wt 180 lb (81.6 kg)   LMP 08/10/2012   BMI 28.19 kg/m   Review of Systems: See HPI above.     Objective:  Physical Exam:  Gen: NAD, comfortable in exam room  Right knee/leg: Bruising posterior thigh mid-distally more medially.  No other deformity. TTP proximal-mid medial hamstring.  No obvious defect.Marland Kitchen FROM knee and hip.  Strength 3/5 with knee flexion.  5/5 other lower extremity motions. Negative ant/post  drawers. Negative valgus/varus testing. Negative lachmans. Negative mcmurrays, apleys. NV intact distally.  Left knee/leg: No deformity. FROM with 5/5 strength. No tenderness to palpation. NVI distally.   Assessment & Plan:  1. Right hamstring strain - consistent with grade 2 strain.  Compression.  Meloxicam with robaxin as needed.  Icing.  Reviewed HEP to start when tolerated.  F/u in 2 weeks - out of work in meantime.

## 2018-05-11 ENCOUNTER — Telehealth: Payer: Self-pay | Admitting: Family Medicine

## 2018-05-11 MED ORDER — MELOXICAM 7.5 MG PO TABS: 7.5000 mg | ORAL_TABLET | Freq: Every day | ORAL | 1 refills | Status: DC

## 2018-05-11 NOTE — Telephone Encounter (Signed)
I'm sorry - I don't think I had sent this in.  It's there now.  Thanks!

## 2018-05-11 NOTE — Telephone Encounter (Signed)
Patient requesting Rx of Meloxicam   Preferred Pharmacy: [facility] on [address]

## 2018-05-11 NOTE — Telephone Encounter (Signed)
Left message informing patient of medication being sent in.  DPR on file

## 2018-05-21 ENCOUNTER — Ambulatory Visit: Payer: 59 | Admitting: Family Medicine

## 2018-05-21 ENCOUNTER — Encounter: Payer: Self-pay | Admitting: Family Medicine

## 2018-05-21 VITALS — BP 158/97 | HR 90 | Ht 67.0 in | Wt 180.0 lb

## 2018-05-21 DIAGNOSIS — S76301D Unspecified injury of muscle, fascia and tendon of the posterior muscle group at thigh level, right thigh, subsequent encounter: Secondary | ICD-10-CM | POA: Diagnosis not present

## 2018-05-21 NOTE — Patient Instructions (Addendum)
You have a hamstring strain. Wear ACE wrap or compression sleeve when up and walking around for another 4 weeks. Continue ibuprofen as you have been. Robaxin as needed for spasms. Ice (or heat at this point if it feels better) 15 minutes at a time 3-4 times a day. Leg curls, hamstring swings when tolerated 3 sets of 10 once a day. Advance to lunges when tolerated. Consider physical therapy as well - call me if you want to do this. Follow up with me in 4 weeks - out of work in meantime.

## 2018-05-21 NOTE — Progress Notes (Signed)
PCP: Patient, No Pcp Per  Subjective:   HPI: Patient is a 57 y.o. female here for right hamstring injury.  10/3: Patient reports on 9/24 she was at home when she stepped on a plastic bag and did the splits with right leg extended behind her, struck face on a cabinet. Immediate pain posterior right thigh and difficulty bearing weight. Taking ibuprofen and robaxin. Pain level at 7/10 and sharp in posterior right thigh. Using cane to help with weight bearing. Associated bruising and swelling.  No numbness.  10/17: Patient presents for follow-up for right hamstring strain.  She is now approximately 3 weeks post injury.  She is now reporting 6/10 pain which is slightly worse over the medial aspect of her hamstring.  She has been using ice and ibuprofen.  Mobic was not helpful.  She uses a cane for ambulation.  She has also been regularly wrapping her thigh with Ace wrap which helps.  She just recently began doing the home exercises.  She did attempt to return to work but was unable.  She denies any worsening swelling.  Bruising is resolving but tracking more distally.  No numbness or tingling in the distal extremity.  No other associated skin changes.  She will follow-up  Past Medical History:  Diagnosis Date  . GERD (gastroesophageal reflux disease)     Current Outpatient Medications on File Prior to Visit  Medication Sig Dispense Refill  . Bioflavonoid Products (VITAMIN C) CHEW Chew 1 tablet by mouth daily.    Marland Kitchen esomeprazole (NEXIUM) 40 MG capsule Take 1 capsule (40 mg total) by mouth daily before breakfast. 30 capsule 11  . ibuprofen (ADVIL,MOTRIN) 600 MG tablet Take 1 tablet (600 mg total) by mouth every 6 (six) hours as needed. 30 tablet 0  . isometheptene-acetaminophen-dichloralphenazone (MIDRIN) 65-100-325 MG capsule Take 1 capsule by mouth 4 (four) times daily as needed for migraine. Maximum 5 capsules in 12 hours for migraine headaches, 8 capsules in 24 hours for tension headaches. 30  capsule 0  . meloxicam (MOBIC) 7.5 MG tablet Take 1 tablet (7.5 mg total) by mouth daily. 30 tablet 1  . methocarbamol (ROBAXIN) 500 MG tablet Take 1 tablet (500 mg total) by mouth 2 (two) times daily. 20 tablet 0  . Multiple Vitamin (MULTIVITAMIN) tablet Take 1 tablet by mouth daily.     No current facility-administered medications on file prior to visit.     No past surgical history on file.  Allergies  Allergen Reactions  . Isovue [Iopamidol] Shortness Of Breath and Other (See Comments)    Pt states she had IV contrast 5 yrs ago and had SOB and her throat got very tight for about 30 secs.  We did w/o contrast on 05/16/17.  Pt needs full premeds in the future.  Alfonse Alpers, RTRCT    Social History   Socioeconomic History  . Marital status: Single    Spouse name: Not on file  . Number of children: Not on file  . Years of education: Not on file  . Highest education level: Not on file  Occupational History  . Occupation: nurse  Social Needs  . Financial resource strain: Not on file  . Food insecurity:    Worry: Not on file    Inability: Not on file  . Transportation needs:    Medical: Not on file    Non-medical: Not on file  Tobacco Use  . Smoking status: Never Smoker  . Smokeless tobacco: Never Used  Substance and Sexual  Activity  . Alcohol use: No    Comment: rarely  . Drug use: No  . Sexual activity: Not on file  Lifestyle  . Physical activity:    Days per week: Not on file    Minutes per session: Not on file  . Stress: Not on file  Relationships  . Social connections:    Talks on phone: Not on file    Gets together: Not on file    Attends religious service: Not on file    Active member of club or organization: Not on file    Attends meetings of clubs or organizations: Not on file    Relationship status: Not on file  . Intimate partner violence:    Fear of current or ex partner: Not on file    Emotionally abused: Not on file    Physically abused: Not on file     Forced sexual activity: Not on file  Other Topics Concern  . Not on file  Social History Narrative  . Not on file    Family History  Problem Relation Age of Onset  . Heart disease Mother   . Brain cancer Mother        tumor  . Atrial fibrillation Father     BP (!) 158/97   Pulse 90   Ht 5\' 7"  (1.702 m)   Wt 180 lb (81.6 kg)   LMP 08/10/2012   BMI 28.19 kg/m   Review of Systems: See HPI above.     Objective:  Physical Exam:  GEN: Awake, alert, no acute distress  Right leg: No obvious deformity.  Bruising over the posterior thigh and over the proximal calf. Diffuse tenderness over the biceps femoris and semimembranosus/tendinosis muscle bellies as well as the distal tendons.  She is also tender proximally at the insertion on the ischial tuberosity. She can achieve full range of motion with knee flexion and extension, however there is pain with knee flexion. 3+/5 strength in the flexion with pain, 5/5 strength in knee extension N/V intact  Assessment & Plan:  1.  Right hamstring strain- mild improvement since previous visit 2 weeks ago.  She will continue ibuprofen as needed, ice, and compression.  Cane with ambulation as needed.  She will continue to work on home strengthening exercises and advance as tolerated.  Still unable to return to work at this time.  She will follow-up in 4 weeks; if she is struggling to improve and do home exercises, will refer for physical therapy.

## 2018-05-22 ENCOUNTER — Encounter: Payer: Self-pay | Admitting: Family Medicine

## 2018-06-15 ENCOUNTER — Encounter: Payer: Self-pay | Admitting: Family Medicine

## 2018-06-15 ENCOUNTER — Ambulatory Visit: Payer: 59 | Admitting: Family Medicine

## 2018-06-15 VITALS — BP 157/101 | HR 74 | Ht 67.0 in | Wt 178.0 lb

## 2018-06-15 DIAGNOSIS — S76301D Unspecified injury of muscle, fascia and tendon of the posterior muscle group at thigh level, right thigh, subsequent encounter: Secondary | ICD-10-CM | POA: Diagnosis not present

## 2018-06-15 NOTE — Progress Notes (Signed)
PCP: Patient, No Pcp Per  Subjective:   HPI: Patient is a 57 y.o. female here for right hamstring injury.  10/3: Patient reports on 9/24 she was at home when she stepped on a plastic bag and did the splits with right leg extended behind her, struck face on a cabinet. Immediate pain posterior right thigh and difficulty bearing weight. Taking ibuprofen and robaxin. Pain level at 7/10 and sharp in posterior right thigh. Using cane to help with weight bearing. Associated bruising and swelling.  No numbness.  10/17: Patient presents for follow-up for right hamstring strain.  She is now approximately 3 weeks post injury.  She is now reporting 6/10 pain which is slightly worse over the medial aspect of her hamstring.  She has been using ice and ibuprofen.  Mobic was not helpful.  She uses a cane for ambulation.  She has also been regularly wrapping her thigh with Ace wrap which helps.  She just recently began doing the home exercises.  She did attempt to return to work but was unable.  She denies any worsening swelling.  Bruising is resolving but tracking more distally.  No numbness or tingling in the distal extremity.  No other associated skin changes.   11/11: Patient returns for follow-up for right hamstring strain.  She reports she is doing much better.  She has 3/10 pain.  Is worse with squatting or bending.  Otherwise, she has been able to walk around the neighborhood without further use of a cane.  She was able to mow her lawn.  She is not requiring any pain medications.  She has been doing home exercises which she feels were helpful.  Has started taking glucosamine supplements.  She states that the bruising has resolved.  She does note slight pain in the anterior right knee.  No swelling, erythema, or skin changes  Past Medical History:  Diagnosis Date  . GERD (gastroesophageal reflux disease)     Current Outpatient Medications on File Prior to Visit  Medication Sig Dispense Refill  .  Bioflavonoid Products (VITAMIN C) CHEW Chew 1 tablet by mouth daily.    Marland Kitchen esomeprazole (NEXIUM) 40 MG capsule Take 1 capsule (40 mg total) by mouth daily before breakfast. 30 capsule 11  . ibuprofen (ADVIL,MOTRIN) 600 MG tablet Take 1 tablet (600 mg total) by mouth every 6 (six) hours as needed. 30 tablet 0  . isometheptene-acetaminophen-dichloralphenazone (MIDRIN) 65-100-325 MG capsule Take 1 capsule by mouth 4 (four) times daily as needed for migraine. Maximum 5 capsules in 12 hours for migraine headaches, 8 capsules in 24 hours for tension headaches. 30 capsule 0  . meloxicam (MOBIC) 7.5 MG tablet Take 1 tablet (7.5 mg total) by mouth daily. 30 tablet 1  . methocarbamol (ROBAXIN) 500 MG tablet Take 1 tablet (500 mg total) by mouth 2 (two) times daily. 20 tablet 0  . Multiple Vitamin (MULTIVITAMIN) tablet Take 1 tablet by mouth daily.     No current facility-administered medications on file prior to visit.     No past surgical history on file.  Allergies  Allergen Reactions  . Isovue [Iopamidol] Shortness Of Breath and Other (See Comments)    Pt states she had IV contrast 5 yrs ago and had SOB and her throat got very tight for about 30 secs.  We did w/o contrast on 05/16/17.  Pt needs full premeds in the future.  Alfonse Alpers, RTRCT    Social History   Socioeconomic History  . Marital status: Single  Spouse name: Not on file  . Number of children: Not on file  . Years of education: Not on file  . Highest education level: Not on file  Occupational History  . Occupation: nurse  Social Needs  . Financial resource strain: Not on file  . Food insecurity:    Worry: Not on file    Inability: Not on file  . Transportation needs:    Medical: Not on file    Non-medical: Not on file  Tobacco Use  . Smoking status: Never Smoker  . Smokeless tobacco: Never Used  Substance and Sexual Activity  . Alcohol use: No    Comment: rarely  . Drug use: No  . Sexual activity: Not on file  Lifestyle   . Physical activity:    Days per week: Not on file    Minutes per session: Not on file  . Stress: Not on file  Relationships  . Social connections:    Talks on phone: Not on file    Gets together: Not on file    Attends religious service: Not on file    Active member of club or organization: Not on file    Attends meetings of clubs or organizations: Not on file    Relationship status: Not on file  . Intimate partner violence:    Fear of current or ex partner: Not on file    Emotionally abused: Not on file    Physically abused: Not on file    Forced sexual activity: Not on file  Other Topics Concern  . Not on file  Social History Narrative  . Not on file    Family History  Problem Relation Age of Onset  . Heart disease Mother   . Brain cancer Mother        tumor  . Atrial fibrillation Father     BP (!) 157/101   Pulse 74   Ht 5\' 7"  (1.702 m)   Wt 178 lb (80.7 kg)   LMP 08/10/2012   BMI 27.88 kg/m   Review of Systems: See HPI above.     Objective:  Physical Exam:  GEN: Awake, alert, no acute distress Pulmonary: Breathing unlabored  Right leg: No deformity.  No bruising Mild pain over the distal insertion of the biceps femoris and semimembranosus/tendinosis muscles.  No pain over the muscle bellies.  Mild tenderness proximally in the issue tuberosity Full range of motion of the knee and hip. 5/5 strength.  Mild pain with resisted knee flexion. N/V intact  Right knee: No effusion Mild tenderness over the medial edge of the patellar tendon Full range of motion 5/5 strength.  Pain with resisted knee flexion N/V intact   Assessment & Plan:  1.  Right hamstring strain- patient is improving well.  She has mild pain at this point.  She feels she is able to return to work.  I feel that based on exam, she is safe to do so.  Patient will be released for full duty at work.  She is instructed to continue her home exercises for an additional 4 weeks.  Tylenol or  NSAIDs as needed.  Follow-up as needed.

## 2018-06-15 NOTE — Patient Instructions (Signed)
Continue your home exercises for 4 more weeks. Call me if you have any problems otherwise follow up as needed.

## 2018-06-16 ENCOUNTER — Encounter: Payer: Self-pay | Admitting: Family Medicine

## 2018-06-18 ENCOUNTER — Ambulatory Visit: Payer: 59 | Admitting: Family Medicine

## 2018-07-17 DIAGNOSIS — L918 Other hypertrophic disorders of the skin: Secondary | ICD-10-CM | POA: Diagnosis not present

## 2018-10-24 IMAGING — CT CT ABD-PELV W/O CM
1 of 2 series · 15 of 32 positions shown, 19 images · non-contrast
Comparison: None.

CLINICAL DATA: Left lower quadrant abdominal pain for several
weeks.

EXAM:
CT ABDOMEN AND PELVIS WITHOUT CONTRAST
TECHNIQUE: Multidetector CT imaging of the abdomen and pelvis was performed
following the standard protocol without IV contrast.

[Series 2: abd/pelvis w/(date) · axial · 0.82mm/px · z∈[-401,+49]mm · 15 of 100 slices shown, 19 images]
[im 5/100  soft-tissue]
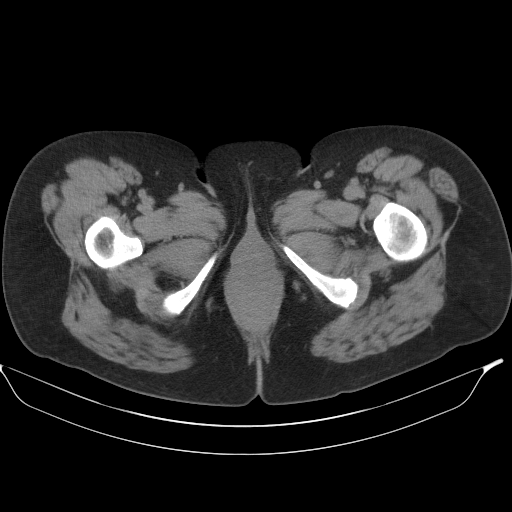
[im 5/100  bone]
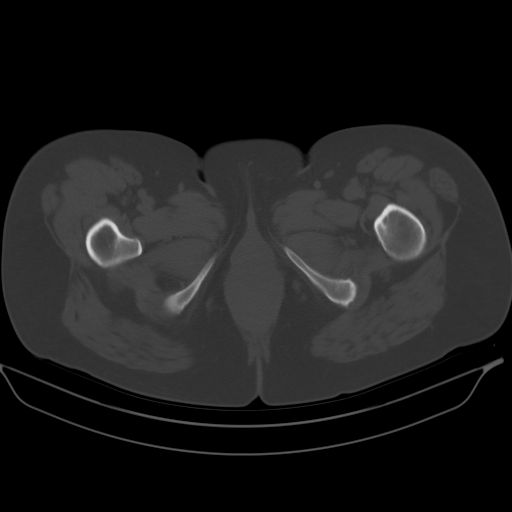
[im 14/100  soft-tissue]
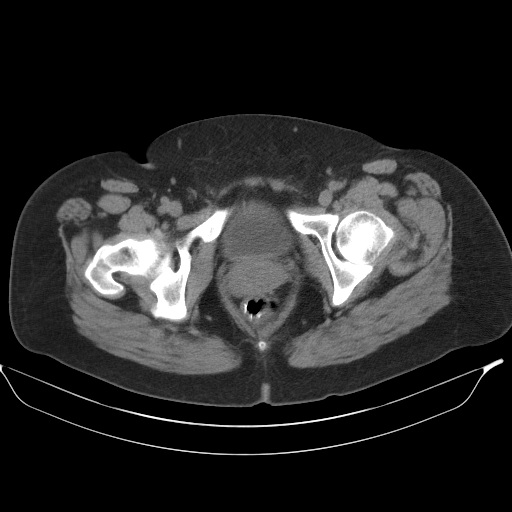
[im 23/100  soft-tissue]
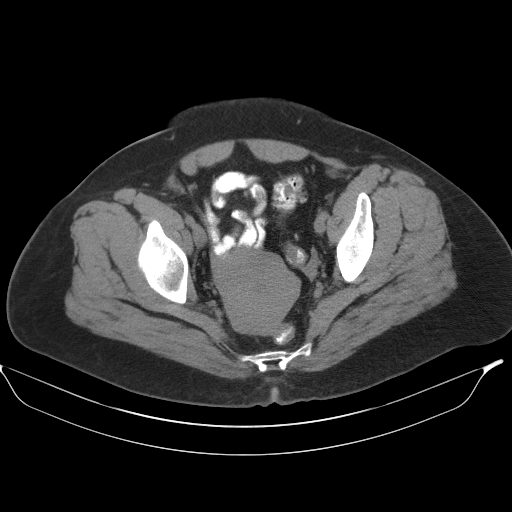
[im 28/100  soft-tissue]
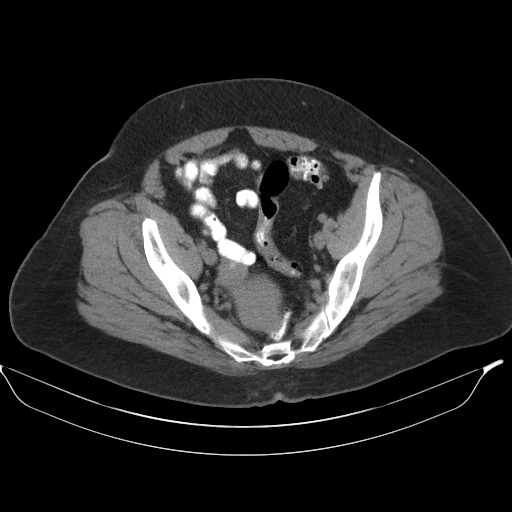
[im 37/100  soft-tissue]
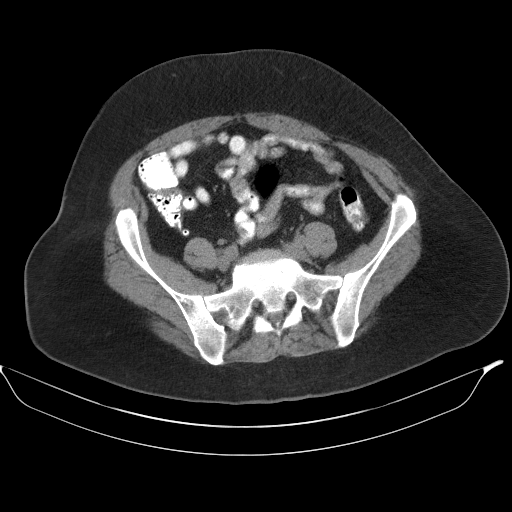
[im 41/100  soft-tissue]
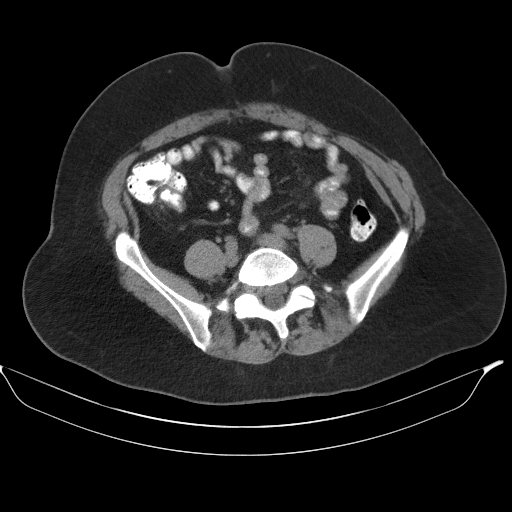
[im 50/100  soft-tissue]
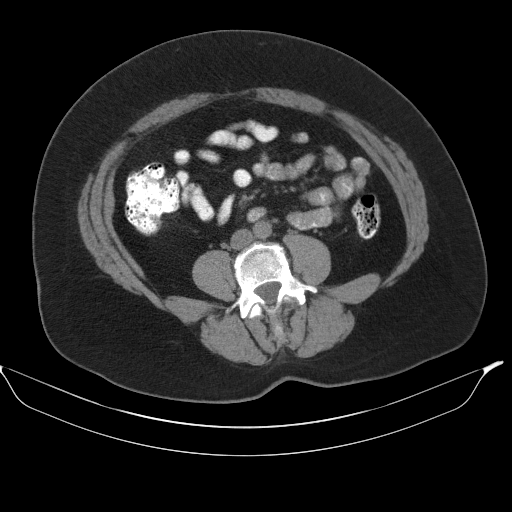
[im 59/100  soft-tissue]
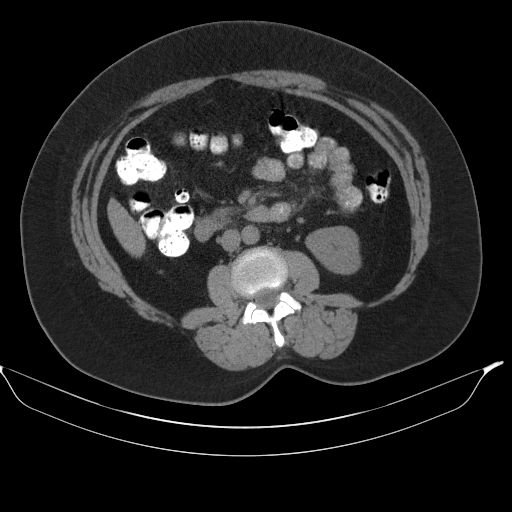
[im 64/100  soft-tissue]
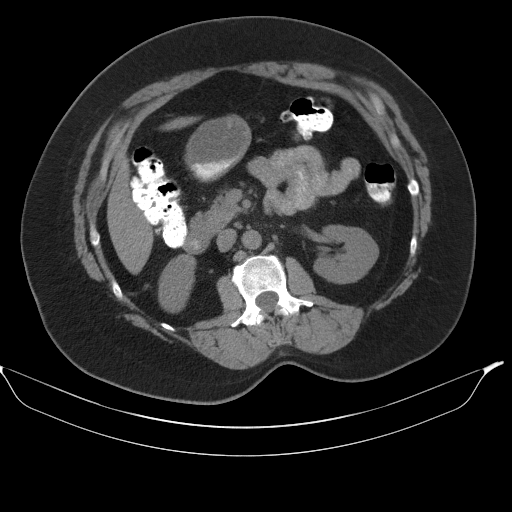
[im 64/100  bone]
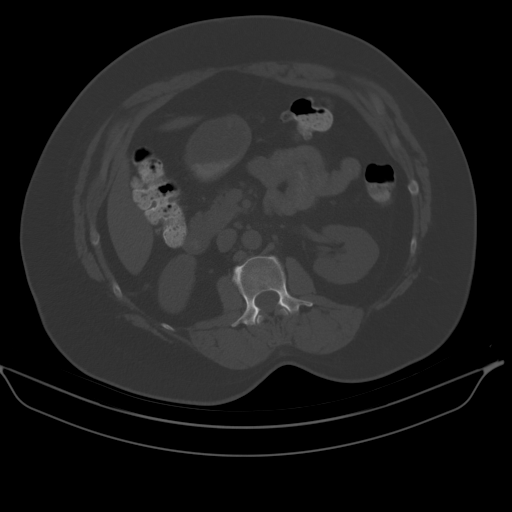
[im 73/100  soft-tissue]
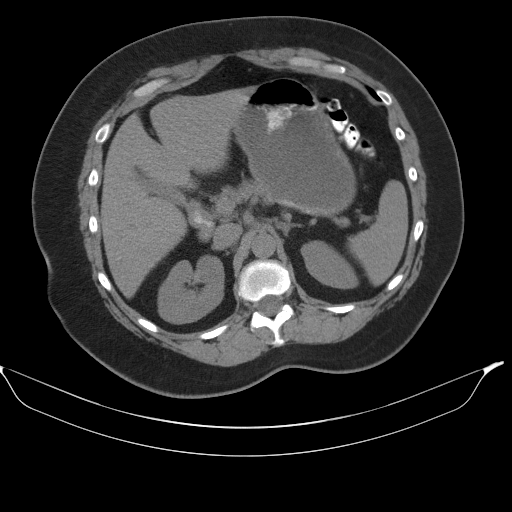
[im 77/100  soft-tissue]
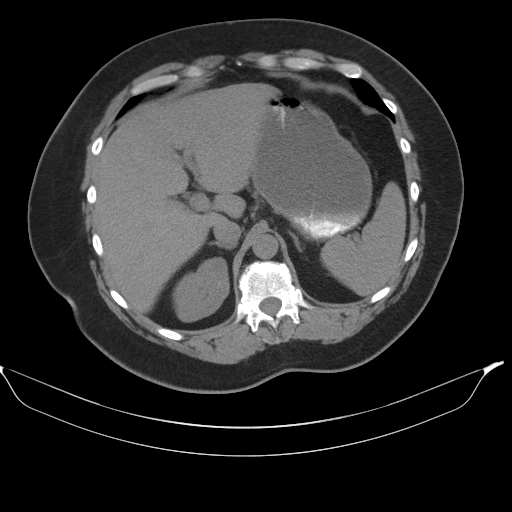
[im 82/100  lung]
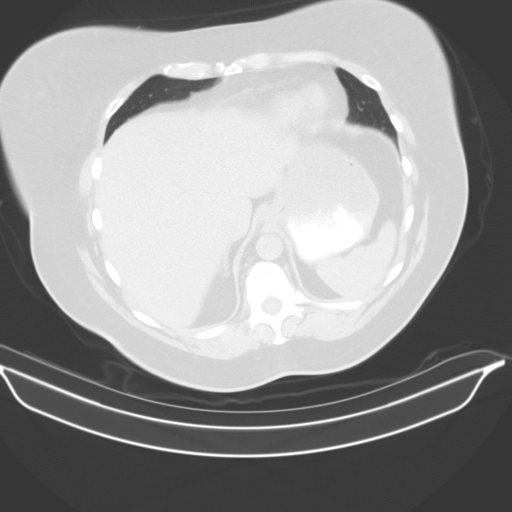
[im 86/100  soft-tissue]
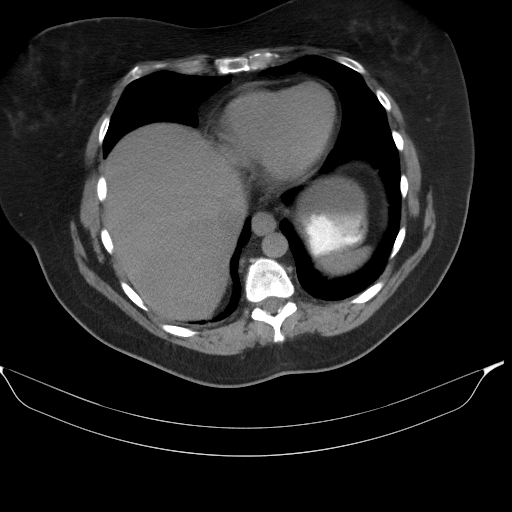
[im 86/100  lung]
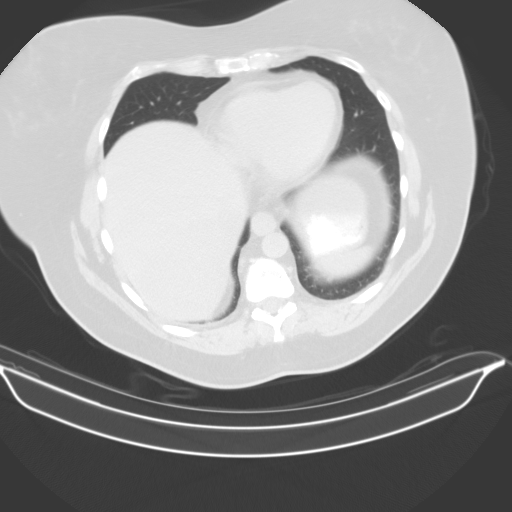
[im 91/100  lung]
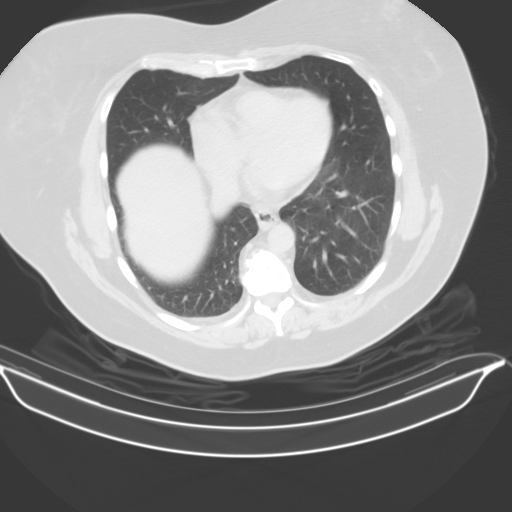
[im 95/100  soft-tissue]
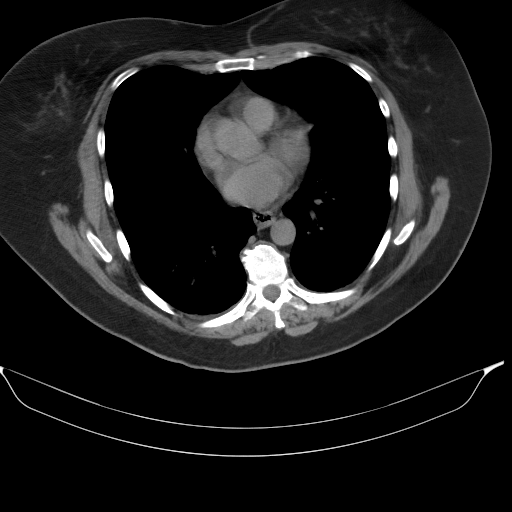
[im 95/100  lung]
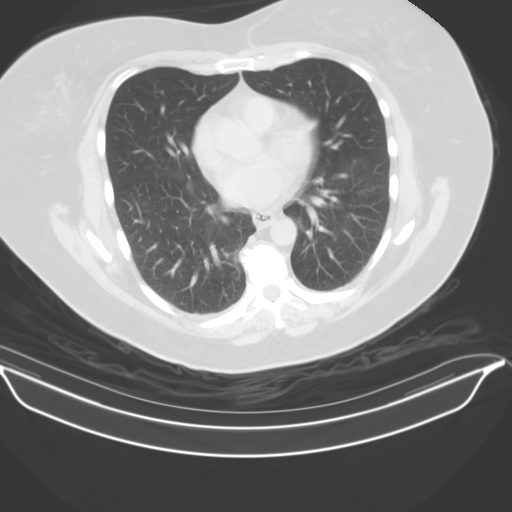

[15 of 32 positions shown; findings below may reference images not displayed]

FINDINGS: Lower chest: No acute abnormality.

Hepatobiliary: No focal liver abnormality is seen. No gallstones,
gallbladder wall thickening, or biliary dilatation.

Pancreas: Unremarkable. No pancreatic ductal dilatation or
surrounding inflammatory changes.

Spleen: Normal in size without focal abnormality.

Adrenals/Urinary Tract: Adrenal glands are unremarkable. Kidneys are
normal, without renal calculi, focal lesion, or hydronephrosis.
Bladder is unremarkable.

Stomach/Bowel: Stomach is within normal limits. Appendix appears
normal. No evidence of bowel wall thickening, distention, or
inflammatory changes.

Vascular/Lymphatic: No significant vascular findings are present. No
enlarged abdominal or pelvic lymph nodes.

Reproductive: Uterus and bilateral adnexa are unremarkable.

Other: No abdominal wall hernia or abnormality. No abdominopelvic
ascites.

Musculoskeletal: No acute or significant osseous findings.
IMPRESSION: No significant abnormality seen in the abdomen or pelvis.

## 2018-12-01 DIAGNOSIS — K5730 Diverticulosis of large intestine without perforation or abscess without bleeding: Secondary | ICD-10-CM | POA: Insufficient documentation

## 2018-12-01 DIAGNOSIS — Z6830 Body mass index (BMI) 30.0-30.9, adult: Secondary | ICD-10-CM | POA: Diagnosis not present

## 2018-12-01 DIAGNOSIS — R102 Pelvic and perineal pain: Secondary | ICD-10-CM | POA: Diagnosis not present

## 2018-12-01 DIAGNOSIS — Z01419 Encounter for gynecological examination (general) (routine) without abnormal findings: Secondary | ICD-10-CM | POA: Diagnosis not present

## 2018-12-01 LAB — CBC AND DIFFERENTIAL
HCT: 45 (ref 36–46)
Hemoglobin: 15.3 (ref 12.0–16.0)
Neutrophils Absolute: 4
Platelets: 248 (ref 150–399)
WBC: 5.8

## 2018-12-01 LAB — BASIC METABOLIC PANEL
BUN: 14 (ref 4–21)
CO2: 20 (ref 13–22)
Chloride: 105 (ref 99–108)
Creatinine: 0.7 (ref 0.5–1.1)
Glucose: 108
Potassium: 4.5 (ref 3.4–5.3)
Sodium: 142 (ref 137–147)

## 2018-12-01 LAB — HEPATIC FUNCTION PANEL
ALT: 23 (ref 7–35)
AST: 15 (ref 13–35)
Alkaline Phosphatase: 119 (ref 25–125)
Bilirubin, Total: 0.3

## 2018-12-01 LAB — COMPREHENSIVE METABOLIC PANEL
Albumin: 4.7 (ref 3.5–5.0)
Calcium: 9.7 (ref 8.7–10.7)
GFR calc non Af Amer: 91

## 2018-12-01 LAB — CBC: RBC: 5.11 (ref 3.87–5.11)

## 2018-12-01 LAB — HM PAP SMEAR: HM Pap smear: NEGATIVE

## 2018-12-14 DIAGNOSIS — R1032 Left lower quadrant pain: Secondary | ICD-10-CM | POA: Diagnosis not present

## 2019-01-04 ENCOUNTER — Encounter (HOSPITAL_COMMUNITY): Payer: Self-pay | Admitting: Family Medicine

## 2019-01-04 ENCOUNTER — Other Ambulatory Visit: Payer: Self-pay

## 2019-01-04 ENCOUNTER — Telehealth (HOSPITAL_COMMUNITY): Payer: Self-pay | Admitting: Emergency Medicine

## 2019-01-04 ENCOUNTER — Ambulatory Visit (HOSPITAL_COMMUNITY)
Admission: EM | Admit: 2019-01-04 | Discharge: 2019-01-04 | Disposition: A | Discharge status: Home / Self Care | Payer: 59 | Attending: Family Medicine | Admitting: Family Medicine

## 2019-01-04 DIAGNOSIS — L989 Disorder of the skin and subcutaneous tissue, unspecified: Secondary | ICD-10-CM | POA: Diagnosis not present

## 2019-01-04 DIAGNOSIS — R002 Palpitations: Secondary | ICD-10-CM | POA: Diagnosis not present

## 2019-01-04 DIAGNOSIS — R21 Rash and other nonspecific skin eruption: Secondary | ICD-10-CM | POA: Diagnosis not present

## 2019-01-04 MED ORDER — DOXYCYCLINE HYCLATE 100 MG PO TABS: 100.0000 mg | ORAL_TABLET | Freq: Two times a day (BID) | ORAL | 0 refills | Status: DC

## 2019-01-04 NOTE — ED Provider Notes (Signed)
[facility]    CSN: [id number] Arrival date & time: 01/04/19  1658     History   Chief Complaint Chief Complaint  Patient presents with  . Hand Pain    HPI Autumn Wall is a 58 y.o. female.   This a 58 year old established female patient at [facility] urgent care.  Patient presents with right hand rash intermittently over the past two weeks.  Also has had headache and malaise.  Today she noticed an irregular pulse.  No known tick bite but she has been outside quite a bit lately.  She had an unusual ring-like rash on thenar aspect of right palm recently.  Patient also wants [place] to check a dry papule at the left frontal hairline which has been present for some time.   She has fair complexion.  Patient is a Marine scientist for the hospital.     Past Medical History:  Diagnosis Date  . GERD (gastroesophageal reflux disease)     Patient Active Problem List   Diagnosis Date Noted  . GERD (gastroesophageal reflux disease) 07/22/2012  . Diverticulosis 07/22/2012    History reviewed. No pertinent surgical history.  OB History   No obstetric history on file.      Home Medications    Prior to Admission medications   Medication Sig Start Date End Date Taking? Authorizing Provider  Bioflavonoid Products (VITAMIN C) CHEW Chew 1 tablet by mouth daily.    [provider]  esomeprazole (NEXIUM) 40 MG capsule Take 1 capsule (40 mg total) by mouth daily before breakfast. 08/24/12   Irene Shipper, MD  ibuprofen (ADVIL,MOTRIN) 600 MG tablet Take 1 tablet (600 mg total) by mouth every 6 (six) hours as needed. 05/05/18   Domenic Moras, PA-C  isometheptene-acetaminophen-dichloralphenazone (MIDRIN) [phone] MG capsule Take 1 capsule by mouth 4 (four) times daily as needed for migraine. Maximum 5 capsules in 12 hours for migraine headaches, 8 capsules in 24 hours for tension headaches. 09/27/15   Fransico Meadow, PA-C  meloxicam (MOBIC) 7.5 MG tablet Take 1 tablet (7.5 mg  total) by mouth daily. 05/11/18   Hudnall, Sharyn Lull, MD  methocarbamol (ROBAXIN) 500 MG tablet Take 1 tablet (500 mg total) by mouth 2 (two) times daily. 05/05/18   Domenic Moras, PA-C  Multiple Vitamin (MULTIVITAMIN) tablet Take 1 tablet by mouth daily.    [provider]    Family History Family History  Problem Relation Age of Onset  . Heart disease Mother   . Brain cancer Mother        tumor  . Atrial fibrillation Father     Social History Social History   Tobacco Use  . Smoking status: Never Smoker  . Smokeless tobacco: Never Used  Substance Use Topics  . Alcohol use: No    Comment: rarely  . Drug use: No     Allergies   Isovue [iopamidol]   Review of Systems Review of Systems  Constitutional: Positive for fatigue.  Cardiovascular: Positive for palpitations.  Skin: Positive for rash.  Neurological: Positive for headaches.     Physical Exam Triage Vital Signs ED Triage Vitals  Enc Vitals Group     BP      Pulse      Resp      Temp      Temp src      SpO2      Weight      Height      Head Circumference  Peak Flow      Pain Score      Pain Loc      Pain Edu?      Excl. in [place]?    No data found.  Updated Vital Signs BP (!) 141/106 (BP Location: Right Arm)   Pulse 79   Temp 99.2 F (37.3 C)   Resp 18   LMP 08/10/2012   SpO2 100%    Physical Exam Vitals signs and nursing note reviewed.  Constitutional:      Appearance: Normal appearance.  HENT:     Mouth/Throat:     Mouth: Mucous membranes are moist.  Eyes:     Conjunctiva/sclera: Conjunctivae normal.  Neck:     Musculoskeletal: Normal range of motion and neck supple.  Cardiovascular:     Rate and Rhythm: Normal rate and regular rhythm.     Heart sounds: Normal heart sounds.  Pulmonary:     Effort: Pulmonary effort is normal.     Breath sounds: Normal breath sounds.  Skin:    General: Skin is warm and dry.     Comments: 3 mm raised flesh colored flat papule at hairline of  left frontal area.  Neurological:     Mental Status: She is alert.        UC Treatments / Results  Labs (all labs ordered are listed, but only abnormal results are displayed) Labs Reviewed  B. BURGDORFI ANTIBODIES  EHRLICHIA ANTIBODY PANEL  ROCKY MTN SPOTTED FVR ABS PNL(IGG+IGM)    EKG Normal sinus rhythm  Radiology No results found.  Procedures Procedures (including critical care time)  Medications Ordered in UC Medications - No data to display  Initial Impression / Assessment and Plan / UC Course  I have reviewed the triage vital signs and the nursing notes.  Pertinent labs & imaging results that were available during my care of the patient were reviewed by me and considered in my medical decision making (see chart for details).    Final Clinical Impressions(s) / UC Diagnoses   Final diagnoses:  Rash of hands  Skin lesion of face  Palpitations     Discharge Instructions     The ring shaped hand lesion is very suggestive of a tick borne infection.  Therefore, we are running tests to see if this is the case.  And, I am starting you on doxycycline in case you have been infected.  Our tests should be back in 4 days.  The palpitation symptom I believe is benign.  The cardiogram is normal  The skin lesion at the hairline is benign.  If it bothers you, a dermatologist can freeze it off (perhaps your PCP has liquid nitrogen)    ED Prescriptions    None     Controlled Substance Prescriptions [place] Controlled Substance Registry consulted? Not Applicable   Robyn Haber, MD 01/04/19 1800

## 2019-01-04 NOTE — Discharge Instructions (Addendum)
The ring shaped hand lesion is very suggestive of a tick borne infection.  Therefore, we are running tests to see if this is the case.  And, I am starting you on doxycycline in case you have been infected.  Our tests should be back in 4 days.  The palpitation symptom I believe is benign.  The cardiogram is normal  The skin lesion at the hairline is benign.  If it bothers you, a dermatologist can freeze it off (perhaps your PCP has liquid nitrogen)

## 2019-01-04 NOTE — ED Triage Notes (Signed)
Pt states she had a red ring in her right hand.  It stayed like that for 2 days.  Pt has a dry spot on her forehead for a year now.

## 2019-01-05 LAB — EHRLICHIA ANTIBODY PANEL
E chaffeensis (HGE) Ab, IgG: NEGATIVE
E chaffeensis (HGE) Ab, IgM: NEGATIVE
E. Chaffeensis (HME) IgM Titer: NEGATIVE
E.Chaffeensis (HME) IgG: NEGATIVE

## 2019-01-05 LAB — B. BURGDORFI ANTIBODIES: B burgdorferi Ab IgG+IgM: <0.91 {ISR} (ref 0.00–0.90)

## 2019-01-06 LAB — ROCKY MTN SPOTTED FVR ABS PNL(IGG+IGM)
RMSF IgG: NEGATIVE
RMSF IgM: 0.51 index (ref 0.00–0.89)

## 2019-01-11 ENCOUNTER — Telehealth (HOSPITAL_COMMUNITY): Payer: Self-pay | Admitting: Emergency Medicine

## 2019-01-11 NOTE — Telephone Encounter (Signed)
Attempted to reach patient. No answer at this time. Voicemail left.    

## 2019-01-29 ENCOUNTER — Telehealth (HOSPITAL_COMMUNITY): Payer: Self-pay | Admitting: Emergency Medicine

## 2019-01-29 ENCOUNTER — Encounter (HOSPITAL_COMMUNITY): Payer: Self-pay | Admitting: Emergency Medicine

## 2019-01-29 ENCOUNTER — Ambulatory Visit (HOSPITAL_COMMUNITY)
Admission: EM | Admit: 2019-01-29 | Discharge: 2019-01-29 | Disposition: A | Discharge status: Home / Self Care | Payer: 59 | Attending: Internal Medicine | Admitting: Internal Medicine

## 2019-01-29 ENCOUNTER — Other Ambulatory Visit: Payer: Self-pay

## 2019-01-29 DIAGNOSIS — W57XXXD Bitten or stung by nonvenomous insect and other nonvenomous arthropods, subsequent encounter: Secondary | ICD-10-CM | POA: Insufficient documentation

## 2019-01-29 DIAGNOSIS — R42 Dizziness and giddiness: Secondary | ICD-10-CM | POA: Insufficient documentation

## 2019-01-29 DIAGNOSIS — R002 Palpitations: Secondary | ICD-10-CM | POA: Diagnosis not present

## 2019-01-29 DIAGNOSIS — K219 Gastro-esophageal reflux disease without esophagitis: Secondary | ICD-10-CM | POA: Insufficient documentation

## 2019-01-29 DIAGNOSIS — S60561D Insect bite (nonvenomous) of right hand, subsequent encounter: Secondary | ICD-10-CM

## 2019-01-29 DIAGNOSIS — M7989 Other specified soft tissue disorders: Secondary | ICD-10-CM | POA: Insufficient documentation

## 2019-01-29 LAB — CBC
HCT: 45.1 % (ref 36.0–46.0)
Hemoglobin: 15.1 g/dL — ABNORMAL HIGH (ref 12.0–15.0)
MCH: 30.1 pg (ref 26.0–34.0)
MCHC: 33.5 g/dL (ref 30.0–36.0)
MCV: 89.8 fL (ref 80.0–100.0)
Platelets: 226 10*3/uL (ref 150–400)
RBC: 5.02 MIL/uL (ref 3.87–5.11)
RDW: 13.2 % (ref 11.5–15.5)
WBC: 5.4 10*3/uL (ref 4.0–10.5)
nRBC: 0 % (ref 0.0–0.2)

## 2019-01-29 LAB — BASIC METABOLIC PANEL
Anion gap: 10 (ref 5–15)
BUN: 9 mg/dL (ref 6–20)
CO2: 24 mmol/L (ref 22–32)
Calcium: 9.7 mg/dL (ref 8.9–10.3)
Chloride: 105 mmol/L (ref 98–111)
Creatinine, Ser: 0.76 mg/dL (ref 0.44–1.00)
GFR calc Af Amer: >60 mL/min (ref >60)
GFR calc non Af Amer: >60 mL/min (ref >60)
Glucose, Bld: 118 mg/dL — ABNORMAL HIGH (ref 70–99)
Potassium: 4 mmol/L (ref 3.5–5.1)
Sodium: 139 mmol/L (ref 135–145)

## 2019-01-29 LAB — TSH: TSH: 3.94 u[IU]/mL (ref 0.350–4.500)

## 2019-01-29 NOTE — ED Provider Notes (Addendum)
[facility]    CSN: [id number] Arrival date & time: 01/29/19  0957      History   Chief Complaint Chief Complaint  Patient presents with  . Appointment  . Insect Bite    HPI Autumn Wall is a 58 y.o. female history of gastroesophageal reflux disease comes to urgent care with complaints of right hand swelling and joint pain as well as palpitations and intermittent dizziness.  Patient sustained a tick bite few weeks ago and was treated with a 10-day course of doxycycline.  A work-up for tickborne illness was negative including Lyme's antibodies.  Patient believes that her current symptoms is due to Lyme's disease.  Patient says that palpitations are intermittent it is associated with some dizziness.  She denies any syncope or near syncopal episodes.  No fever or chills.  No cough.  No runny nose.  No rash.   HPI  Past Medical History:  Diagnosis Date  . GERD (gastroesophageal reflux disease)     Patient Active Problem List   Diagnosis Date Noted  . GERD (gastroesophageal reflux disease) 07/22/2012  . Diverticulosis 07/22/2012    History reviewed. No pertinent surgical history.  OB History   No obstetric history on file.      Home Medications    Prior to Admission medications   Medication Sig Start Date End Date Taking? Authorizing Provider  Bioflavonoid Products (VITAMIN C) CHEW Chew 1 tablet by mouth daily.   Yes [provider]  doxycycline (VIBRA-TABS) 100 MG tablet Take 1 tablet (100 mg total) by mouth 2 (two) times daily. 01/04/19  Yes Robyn Haber, MD  Multiple Vitamin (MULTIVITAMIN) tablet Take 1 tablet by mouth daily.   Yes [provider]  isometheptene-acetaminophen-dichloralphenazone (MIDRIN) 65-100-325 MG capsule Take 1 capsule by mouth 4 (four) times daily as needed for migraine. Maximum 5 capsules in 12 hours for migraine headaches, 8 capsules in 24 hours for tension headaches. 09/27/15 01/29/19  Fransico Meadow, PA-C     Family History Family History  Problem Relation Age of Onset  . Heart disease Mother   . Brain cancer Mother        tumor  . Atrial fibrillation Father     Social History Social History   Tobacco Use  . Smoking status: Never Smoker  . Smokeless tobacco: Never Used  Substance Use Topics  . Alcohol use: No    Comment: rarely  . Drug use: No     Allergies   Isovue [iopamidol]   Review of Systems Review of Systems  Constitutional: Negative for activity change, appetite change, chills, fatigue and fever.  Eyes: Negative for pain, discharge and itching.  Gastrointestinal: Negative.   Genitourinary: Negative.   Musculoskeletal: Negative.  Negative for arthralgias, joint swelling and neck pain.  Neurological: Negative for dizziness, numbness and headaches.  Psychiatric/Behavioral: Negative for agitation, confusion and decreased concentration.     Physical Exam Triage Vital Signs ED Triage Vitals  Enc Vitals Group     BP 01/29/19 1046 (!) 159/100     Pulse Rate 01/29/19 1021 73     Resp 01/29/19 1021 16     Temp 01/29/19 1021 97.9 F (36.6 C)     Temp Source 01/29/19 1021 Oral     SpO2 01/29/19 1021 97 %     Weight --      Height --      Head Circumference --      Peak Flow --      Pain  Score 01/29/19 1019 4     Pain Loc --      Pain Edu? --      Excl. in [place]? --    No data found.  Updated Vital Signs BP (!) 159/100   Pulse 73   Temp 97.9 F (36.6 C) (Oral)   Resp 16   LMP 08/10/2012   SpO2 97%   Visual Acuity Right Eye Distance:   Left Eye Distance:   Bilateral Distance:    Right Eye Near:   Left Eye Near:    Bilateral Near:     Physical Exam Constitutional:      General: She is not in acute distress.    Appearance: Normal appearance. She is not ill-appearing or toxic-appearing.  HENT:     Mouth/Throat:     Pharynx: Oropharynx is clear.  Neck:     Musculoskeletal: Normal range of motion and neck supple.  Cardiovascular:     Rate and  Rhythm: Normal rate and regular rhythm.     Pulses: Normal pulses.     Heart sounds: Normal heart sounds.  Pulmonary:     Effort: Pulmonary effort is normal. No respiratory distress.     Breath sounds: Normal breath sounds. No wheezing or rhonchi.  Abdominal:     General: Bowel sounds are normal. There is no distension.     Palpations: Abdomen is soft.     Tenderness: There is no abdominal tenderness. There is no guarding.  Musculoskeletal: Normal range of motion.     Comments: Mild swelling in the right hand with painful range of motion around the bed metacarpophalangeal joint of the right hand.  No rashes noted.  Skin:    General: Skin is warm.     Capillary Refill: Capillary refill takes less than 2 seconds.  Neurological:     Mental Status: She is alert.      UC Treatments / Results  Labs (all labs ordered are listed, but only abnormal results are displayed) Labs Reviewed  B. BURGDORFI ANTIBODIES  CBC  TSH  BASIC METABOLIC PANEL    EKG None  Radiology No results found.  Procedures Procedures (including critical care time)  Medications Ordered in UC Medications - No data to display  Initial Impression / Assessment and Plan / UC Course  I have reviewed the triage vital signs and the nursing notes.  Pertinent labs & imaging results that were available during my care of the patient were reviewed by me and considered in my medical decision making (see chart for details).     1.  Right hand swelling with discomfort: We will repeat Lyme disease antibodies since his been over 3 to 4 weeks since she was initially seen for that.  If she truly has Lyme's disease antibodies will be evident.  Otherwise hand pain and swelling could be secondary to other rheumatologic conditions.  Patient very convinced that she does have Lyme's disease and the hand swelling is as a result of that.  We had a long discussion about being fixated on that diagnosis with a negative work-up so far  when her symptoms could be caused by other diagnoses.  She is agreeable to repeat evaluation for Lyme's disease and will move on to other medical conditions or possible diagnosis.  2.  Intermittent palpitations with intermittent dizziness: Patient is not convinced that this could be anything other than Lyme's disease.  I counseled her that dizziness and palpitations has a wide differential diagnosis and will need to consider  that in order not to miss condition that could be catastrophic to her health.  She agreed with the basic lab work-up but she looks unconvinced that it will be helpful.  She requested some more antibiotics for Lyme's disease but I told her to wait for the results of the Lyme antibodies to make that determination. Final Clinical Impressions(s) / UC Diagnoses   Final diagnoses:  Tick bite, subsequent encounter  Palpitations   Discharge Instructions   None    ED Prescriptions    None     Controlled Substance Prescriptions [place] Controlled Substance Registry consulted? No   Chase Picket, MD 01/29/19 1218    Chase Picket, MD 01/29/19 [phone]

## 2019-01-29 NOTE — ED Triage Notes (Signed)
PT was seen 6/1 for a suspected tick bite. PT was placed on doxycycline BID for 10 days. PT took full course. Originial symptoms are now returning. PT reports right hand swelling, intermittent palpitations, and intermittent dizziness. PT had bloodwork done at previous visit.

## 2019-01-29 NOTE — Telephone Encounter (Signed)
Results are within normal range. Attempted to reach patient. No answer at this time. Pending B. burgdorfi antibodies test.

## 2019-02-01 LAB — B. BURGDORFI ANTIBODIES: B burgdorferi Ab IgG+IgM: <0.91 {ISR} (ref 0.00–0.90)

## 2019-06-09 DIAGNOSIS — K219 Gastro-esophageal reflux disease without esophagitis: Secondary | ICD-10-CM | POA: Diagnosis not present

## 2019-06-09 DIAGNOSIS — R1032 Left lower quadrant pain: Secondary | ICD-10-CM | POA: Diagnosis not present

## 2019-07-16 ENCOUNTER — Telehealth: Payer: 59

## 2019-08-12 ENCOUNTER — Other Ambulatory Visit: Payer: Self-pay

## 2019-08-12 ENCOUNTER — Ambulatory Visit: Payer: 59 | Admitting: Physician Assistant

## 2019-08-12 ENCOUNTER — Encounter: Payer: Self-pay | Admitting: Physician Assistant

## 2019-08-12 VITALS — BP 150/96 | HR 80 | Temp 97.5°F | Ht 65.0 in | Wt 190.2 lb

## 2019-08-12 DIAGNOSIS — F410 Panic disorder [episodic paroxysmal anxiety] without agoraphobia: Secondary | ICD-10-CM

## 2019-08-12 DIAGNOSIS — R030 Elevated blood-pressure reading, without diagnosis of hypertension: Secondary | ICD-10-CM

## 2019-08-12 NOTE — Progress Notes (Signed)
Autumn Wall is a 59 y.o. female here for a new problem.  I acted as a Education administrator for [employer], PA-C Anselmo Pickler, LPN  History of Present Illness:   Chief Complaint  Patient presents with  . Establish Care   Elevated blood pressure reading Currently taking not on any medication. At home blood pressure readings are: 128-130/80's. Patient denies chest pain, SOB, blurred vision, dizziness, unusual headaches, lower leg swelling. Denies excessive caffeine intake, stimulant usage, excessive alcohol intake, or increase in salt consumption.  BP Readings from Last 3 Encounters:  08/12/19 (!) 150/96  01/29/19 (!) 159/100  01/04/19 (!) 141/106   Panic attack Patient was working at [employer] [facility] [employer] with [place] patients and had a panic attack when she had to put on an extra gown and was working in a warm area. 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  Health Maintenance: Immunizations -- UTD Colonoscopy -- pt is scheduling Mammogram -- overdue PAP -- 12/2018 normal per pt will get copy Bone Density -- N/A  Weight -- Weight: 190 lb 4 oz (86.3 kg)   Depression screen PHQ 2/9 08/12/2019  Decreased Interest 0  Down, Depressed, Hopeless 0  PHQ - 2 Score 0    No flowsheet data found.   Other providers/specialists: Patient Care Team: Inda Coke, [place] as PCP - General (Physician Assistant)   Past Medical History:  Diagnosis Date  . Diverticulosis   . Fibroids   . GERD (gastroesophageal reflux disease)   . History of chicken pox   . Vegetarian      Social History   Socioeconomic History  . Marital status: Single    Spouse name: Not on file  . Number of children: Not on file  . Years of education: Not on file  . Highest education level: Not on file  Occupational History  . Occupation: nurse  Tobacco Use   . Smoking status: Never Smoker  . Smokeless tobacco: Never Used  Substance and Sexual Activity  . Alcohol use: No    Comment: rarely  . Drug use: No  . Sexual activity: Not Currently  Other Topics Concern  . Not on file  Social History Narrative   Single    No children   Engineer, agricultural involved   Social Determinants of Health   Financial Resource Strain:   . Difficulty of Paying Living Expenses: Not on file  Food Insecurity:   . Worried About Charity fundraiser in the Last Year: Not on file  . Ran Out of Food in the Last Year: Not on file  Transportation Needs:   . Lack of Transportation (Medical): Not on file  . Lack of Transportation (Non-Medical): Not on file  Physical Activity:   . Days of Exercise per Week: Not on file  . Minutes of Exercise per Session: Not on file  Stress:   . Feeling of Stress : Not on file  Social Connections:   . Frequency of Communication with Friends and Family: Not on file  . Frequency of Social Gatherings with Friends and Family: Not on file  . Attends Religious Services: Not on file  . Active Member of Clubs or Organizations: Not on file  . Attends Archivist Meetings: Not on file  . Marital Status: Not on file  Intimate Partner Violence:   . Fear of  Current or Ex-Partner: Not on file  . Emotionally Abused: Not on file  . Physically Abused: Not on file  . Sexually Abused: Not on file    History reviewed. No pertinent surgical history.  Family History  Problem Relation Age of Onset  . Heart disease Mother   . Brain cancer Mother        tumor  . Atrial fibrillation Father   . Prostate cancer Father   . Colon cancer Neg Hx     Allergies  Allergen Reactions  . Isovue [Iopamidol] Shortness Of Breath and Other (See Comments)    Pt states she had IV contrast 5 yrs ago and had SOB and her throat got very tight for about 30 secs.  We did w/o contrast on 05/16/17.  Pt needs full premeds in the future.  J Bohm, RTRCT  .  Iodinated Diagnostic Agents      Current Medications:   Current Outpatient Medications:  .  Bioflavonoid Products (VITAMIN C) CHEW, Chew 1 tablet by mouth daily., Disp: , Rfl:  .  Multiple Vitamin (MULTIVITAMIN) tablet, Take 1 tablet by mouth daily., Disp: , Rfl:    Review of Systems:   ROS Negative unless otherwise specified per HPI.  Vitals:   Vitals:   08/12/19 0922  BP: (!) 150/96  Pulse: 80  Temp: (!) 97.5 F (36.4 C)  TempSrc: Temporal  SpO2: 99%  Weight: 190 lb 4 oz (86.3 kg)  Height: 5\' 5"  (1.651 m)      Body mass index is 31.66 kg/m.  Physical Exam:   Physical Exam Vitals and nursing note reviewed.  Constitutional:      General: She is not in acute distress.    Appearance: She is well-developed. She is not ill-appearing or toxic-appearing.  Cardiovascular:     Rate and Rhythm: Normal rate and regular rhythm.     Pulses: Normal pulses.     Heart sounds: Normal heart sounds, S1 normal and S2 normal.     Comments: No LE edema Pulmonary:     Effort: Pulmonary effort is normal.     Breath sounds: Normal breath sounds.  Skin:    General: Skin is warm and dry.  Neurological:     Mental Status: She is alert.     GCS: GCS eye subscore is 4. GCS verbal subscore is 5. GCS motor subscore is 6.  Psychiatric:        Speech: Speech normal.        Behavior: Behavior normal. Behavior is cooperative.      Assessment and Plan:   Autumn Wall was seen today for establish care.  Diagnoses and all orders for this visit:  Complaint of panic attack Note provided to accommodate her request to avoid working at [employer]. Follow-up if symptoms persist.  Elevated blood pressure reading Normotensive outside of office per patient report, recommend close monitoring when she rechecks and low threshold for follow-up if numbers consistently >140/90 or if she develops any symptoms.  . Reviewed expectations re: course of current medical issues. . Discussed self-management of  symptoms. . Outlined signs and symptoms indicating need for more acute intervention. . Patient verbalized understanding and all questions were answered. . See orders for this visit as documented in the electronic medical record. . Patient received an After-Visit Summary.  CMA or LPN served as scribe during this visit. History, Physical, and Plan performed by medical provider. The above documentation has been reviewed and is accurate and complete.  Inda Coke, PA-C

## 2019-08-27 DIAGNOSIS — R1032 Left lower quadrant pain: Secondary | ICD-10-CM | POA: Diagnosis not present

## 2019-08-30 ENCOUNTER — Telehealth: Payer: Self-pay | Admitting: *Deleted

## 2019-08-30 NOTE — Telephone Encounter (Signed)
Tried to contact pt regarding letter and form for work unable to leave message voicemail is full. Will try again tomorrow.

## 2019-09-01 ENCOUNTER — Encounter: Payer: Self-pay | Admitting: *Deleted

## 2019-09-01 NOTE — Telephone Encounter (Signed)
MyChart message sent to patient.

## 2019-09-01 NOTE — Telephone Encounter (Signed)
Pt called back, asked her about the form if it is to be able to work at [employer] or [employer]. Pt said to work only at [employer] with [place] patients. Told her there is no place on the form that says one or the other place. She said to write it behind the question. Told her okay, would you like this faxed somewhere or pick up? 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 Tod her will send. Pt verbalized understanding. Form and letter emailed to pt.

## 2019-09-07 ENCOUNTER — Encounter: Payer: Self-pay | Admitting: Physician Assistant

## 2019-11-12 ENCOUNTER — Ambulatory Visit: Payer: 59

## 2019-12-10 DIAGNOSIS — K921 Melena: Secondary | ICD-10-CM | POA: Diagnosis not present

## 2019-12-10 DIAGNOSIS — R109 Unspecified abdominal pain: Secondary | ICD-10-CM | POA: Diagnosis not present

## 2019-12-27 DIAGNOSIS — Z1159 Encounter for screening for other viral diseases: Secondary | ICD-10-CM | POA: Diagnosis not present

## 2019-12-30 DIAGNOSIS — K649 Unspecified hemorrhoids: Secondary | ICD-10-CM | POA: Diagnosis not present

## 2019-12-30 DIAGNOSIS — K5730 Diverticulosis of large intestine without perforation or abscess without bleeding: Secondary | ICD-10-CM | POA: Diagnosis not present

## 2019-12-30 DIAGNOSIS — R1030 Lower abdominal pain, unspecified: Secondary | ICD-10-CM | POA: Diagnosis not present

## 2019-12-30 DIAGNOSIS — K921 Melena: Secondary | ICD-10-CM | POA: Diagnosis not present

## 2019-12-30 LAB — HM COLONOSCOPY

## 2020-06-01 DIAGNOSIS — Z6830 Body mass index (BMI) 30.0-30.9, adult: Secondary | ICD-10-CM | POA: Diagnosis not present

## 2020-06-01 DIAGNOSIS — Z01419 Encounter for gynecological examination (general) (routine) without abnormal findings: Secondary | ICD-10-CM | POA: Diagnosis not present

## 2021-02-16 ENCOUNTER — Other Ambulatory Visit: Payer: Self-pay | Admitting: Obstetrics and Gynecology

## 2021-02-22 ENCOUNTER — Ambulatory Visit (INDEPENDENT_AMBULATORY_CARE_PROVIDER_SITE_OTHER): Payer: 59 | Admitting: Physician Assistant

## 2021-02-22 ENCOUNTER — Other Ambulatory Visit: Payer: Self-pay

## 2021-02-22 ENCOUNTER — Encounter: Payer: Self-pay | Admitting: Physician Assistant

## 2021-02-22 VITALS — BP 165/99 | HR 81 | Temp 97.6°F | Ht 65.0 in | Wt 194.2 lb

## 2021-02-22 DIAGNOSIS — R030 Elevated blood-pressure reading, without diagnosis of hypertension: Secondary | ICD-10-CM

## 2021-02-22 DIAGNOSIS — R5383 Other fatigue: Secondary | ICD-10-CM

## 2021-02-22 DIAGNOSIS — Z0000 Encounter for general adult medical examination without abnormal findings: Secondary | ICD-10-CM

## 2021-02-22 DIAGNOSIS — N644 Mastodynia: Secondary | ICD-10-CM | POA: Diagnosis not present

## 2021-02-22 LAB — LIPID PANEL
Cholesterol: 215 mg/dL — ABNORMAL HIGH (ref 0–200)
HDL: 45.6 mg/dL (ref >39.00)
LDL Cholesterol: 130 mg/dL — ABNORMAL HIGH (ref 0–99)
NonHDL: 169.65
Total CHOL/HDL Ratio: 5
Triglycerides: 196 mg/dL — ABNORMAL HIGH (ref 0.0–149.0)
VLDL: 39.2 mg/dL (ref 0.0–40.0)

## 2021-02-22 LAB — COMPREHENSIVE METABOLIC PANEL
ALT: 34 U/L (ref 0–35)
AST: 22 U/L (ref 0–37)
Albumin: 4.5 g/dL (ref 3.5–5.2)
Alkaline Phosphatase: 93 U/L (ref 39–117)
BUN: 13 mg/dL (ref 6–23)
CO2: 26 mEq/L (ref 19–32)
Calcium: 9.5 mg/dL (ref 8.4–10.5)
Chloride: 101 mEq/L (ref 96–112)
Creatinine, Ser: 0.75 mg/dL (ref 0.40–1.20)
GFR: 87.93 mL/min (ref >60.00)
Glucose, Bld: 104 mg/dL — ABNORMAL HIGH (ref 70–99)
Potassium: 4 mEq/L (ref 3.5–5.1)
Sodium: 137 mEq/L (ref 135–145)
Total Bilirubin: 0.8 mg/dL (ref 0.2–1.2)
Total Protein: 7.1 g/dL (ref 6.0–8.3)

## 2021-02-22 LAB — CBC WITH DIFFERENTIAL/PLATELET
Basophils Absolute: 0 10*3/uL (ref 0.0–0.1)
Basophils Relative: 0.3 % (ref 0.0–3.0)
Eosinophils Absolute: 0.1 10*3/uL (ref 0.0–0.7)
Eosinophils Relative: 2.4 % (ref 0.0–5.0)
HCT: 42 % (ref 36.0–46.0)
Hemoglobin: 14.2 g/dL (ref 12.0–15.0)
Lymphocytes Relative: 23.1 % (ref 12.0–46.0)
Lymphs Abs: 1.4 10*3/uL (ref 0.7–4.0)
MCHC: 33.9 g/dL (ref 30.0–36.0)
MCV: 91.3 fl (ref 78.0–100.0)
Monocytes Absolute: 0.6 10*3/uL (ref 0.1–1.0)
Monocytes Relative: 9.7 % (ref 3.0–12.0)
Neutro Abs: 3.9 10*3/uL (ref 1.4–7.7)
Neutrophils Relative %: 64.5 % (ref 43.0–77.0)
Platelets: 218 10*3/uL (ref 150.0–400.0)
RBC: 4.61 Mil/uL (ref 3.87–5.11)
RDW: 13.8 % (ref 11.5–15.5)
WBC: 6 10*3/uL (ref 4.0–10.5)

## 2021-02-22 LAB — VITAMIN B12: Vitamin B-12: >1550 pg/mL — ABNORMAL HIGH (ref 211–911)

## 2021-02-22 LAB — VITAMIN D 25 HYDROXY (VIT D DEFICIENCY, FRACTURES): VITD: 25.47 ng/mL — ABNORMAL LOW (ref 30.00–100.00)

## 2021-02-22 NOTE — Progress Notes (Deleted)
error 

## 2021-02-22 NOTE — Progress Notes (Signed)
Established Patient Office Visit  Subjective:  Patient ID: Autumn Wall, female    DOB: [birth year]  Age: 60 y.o. MRN: [id number]  CC:  Chief Complaint  Patient presents with   Annual Exam        Fatigue   Breast Pain    Left side     HPI Autumn Wall presents for an annual exam.   Acute concerns: Fatigue, discoloration on lip, and L breast tenderness  She has been experiencing fatigue for the past month. Requesting for a TSH screening for further evaluation. 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   She also complains of left breast pain for a few weeks intermittently. No masses that she can feel.  She developed a random discolored area on lip in the last few years.   Health maintenance: Lifestyle/ exercise: Limited due to night shift position at work  Nutrition: Financial controller diet, cut down on sodas and sugar in diet Mental health: Stable Caffeine: No  Sleep: 4-5 hours per night  Substance use: No Immunizations: up-to-date Colonoscopy: up-to-date, postpone to 2023 Pap: Due on 11/30/21 Mammogram: Last completed on 04/22/2017   Past Medical History:  Diagnosis Date   Diverticulosis    Fibroids    GERD (gastroesophageal reflux disease)    History of chicken pox    Vegetarian     History reviewed. No pertinent surgical history.  Family History  Problem Relation Age of Onset   Heart disease Mother    Brain cancer Mother        tumor   Atrial fibrillation Father    Prostate cancer Father    Colon cancer Neg Hx     Social History   Socioeconomic History   Marital status: Single    Spouse name: Not on file   Number of children: Not on file   Years of education: Not on file   Highest education level: Not on file  Occupational History   Occupation: nurse  Tobacco Use   Smoking status: Never   Smokeless tobacco: Never  Vaping Use   Vaping Use: Never used  Substance and Sexual Activity   Alcohol use: No     Comment: rarely   Drug use: No   Sexual activity: Not Currently  Other Topics Concern   Not on file  Social History Narrative   Single    No children   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     Multiple Vitamin (MULTIVITAMIN) tablet Take 1 tablet by mouth daily.     No facility-administered medications prior to visit.    Allergies  Allergen Reactions   Isovue [Iopamidol] Shortness Of Breath and Other (See Comments)    Pt states she had IV contrast 5 yrs ago and had SOB and her throat got very tight for about 30 secs.  We did w/o contrast on 05/16/17.  Pt needs full premeds in the future.  J Bohm, RTRCT   Iodinated Diagnostic Agents     ROS Review of Systems  Constitutional:  Positive for fatigue.  Skin:  Positive for color change (lip).       (+)Left breast pain  All other systems reviewed and are negative.    Objective:    Physical Exam Constitutional:      General: She is not in acute distress.    Appearance: Normal appearance. She is not ill-appearing.  HENT:     Head: Normocephalic and atraumatic.     Comments: Bottom lip pinpoint slightly pink and raised lesion with no irregular borders     Right Ear: Tympanic membrane, ear canal and external ear normal.     Left Ear: Tympanic membrane, ear canal and external ear normal.     Nose: Nose normal.  Eyes:     Extraocular Movements: Extraocular movements intact.     Pupils: Pupils are equal, round, and reactive to light.  Neck:     Thyroid: No thyroid mass, thyromegaly or thyroid tenderness.  Cardiovascular:     Rate and  Rhythm: Normal rate and regular rhythm.     Pulses: Normal pulses.     Heart sounds: Normal heart sounds.  Pulmonary:     Effort: Pulmonary effort is normal.     Breath sounds: Normal breath sounds.  Chest:  Breasts:    Breasts are symmetrical.     Right: Normal. No inverted nipple, mass, nipple discharge, skin change, tenderness, axillary adenopathy or supraclavicular adenopathy.     Left: Tenderness (axilla diffuse tenderness) present. No inverted nipple, mass, nipple discharge, skin change, axillary adenopathy or supraclavicular adenopathy.  Abdominal:     General: Abdomen is flat. Bowel sounds are normal.     Palpations: Abdomen is soft.  Lymphadenopathy:     Upper Body:     Right upper body: No supraclavicular, axillary or pectoral adenopathy.     Left upper body: No supraclavicular, axillary or pectoral adenopathy.  Skin:    General: Skin is warm.  Neurological:     Mental Status: She is alert and oriented to person, place, and time.    BP (!) 165/99   Pulse 81   Temp 97.6 F (36.4 C)   Ht 5\' 5"  (1.651 m)   Wt 194 lb 3.2 oz (88.1 kg)   LMP 08/10/2012   SpO2 97%   BMI 32.32 kg/m  Wt Readings from Last 3 Encounters:  02/22/21 194 lb 3.2 oz (88.1 kg)  08/12/19 190 lb 4 oz (86.3 kg)  06/15/18 178 lb (80.7 kg)     Health Maintenance Due  Topic Date Due   COVID-19 Vaccine (1) Never done   MAMMOGRAM  04/22/2018    There are no preventive care reminders to display for this patient.  Lab Results  Component Value Date   TSH 3.940 01/29/2019   Lab Results  Component Value Date   WBC 5.4 01/29/2019   HGB 15.1 (H) 01/29/2019   HCT 45.1 01/29/2019   MCV 89.8 01/29/2019   PLT 226 01/29/2019   Lab Results  Component Value Date   NA 139 01/29/2019   K 4.0 01/29/2019   CO2 24 01/29/2019   GLUCOSE 118 (H) 01/29/2019   BUN 9 01/29/2019   CREATININE 0.76 01/29/2019   ALKPHOS 119 12/01/2018   AST 15 12/01/2018   ALT 23 12/01/2018   ALBUMIN 4.7 12/01/2018    CALCIUM 9.7 01/29/2019   ANIONGAP 10 01/29/2019   No results found for: CHOL No results found for: HDL No results found for: LDLCALC No results found for: TRIG No results found  for: CHOLHDL No results found for: HGBA1C    Assessment & Plan:   Problem List Items Addressed This Visit   None Visit Diagnoses     Encounter for annual physical exam    -  Primary   Relevant Orders   CBC with Differential/Platelet   Comprehensive metabolic panel   Lipid panel   Thyroid Panel With TSH   Vitamin B12   VITAMIN D 25 Hydroxy (Vit-D Deficiency, Fractures)   Other fatigue       Relevant Orders   Thyroid Panel With TSH   Vitamin B12   VITAMIN D 25 Hydroxy (Vit-D Deficiency, Fractures)   Breast pain, left       Relevant Orders   MM Digital Diagnostic Bilat   Elevated blood pressure reading           No orders of the defined types were placed in this encounter.   Follow-up: Return in about 1 year (around 02/22/2022) for CPE and fasting labs with Sam.   1. Encounter for annual physical exam 2. Other fatigue Age-appropriate screening and counseling performed today. Will check labs and call with results. Fatigue possibly secondary to stress of night shift as well. Encouraged sleep hygiene to the best of her ability and physical activity / healthy diet.  3. Breast pain, left No abnormal findings on exam except diffuse tenderness. She is due for mammogram. Will schedule.  4. Elevated blood pressure reading States that she was very stressed on her shift last night and she is not surprised it is elevated. 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 Will call if readings consistently elevated 130s-140s/90s or higher.    I,Alexis Bryant,acting as a Education administrator for [employer], PA-C.,have documented all relevant documentation on the behalf of Slade Pierpoint M Raeana Blinn, PA-C,as directed by  [employer], PA-C while in the presence of Diera Wirkkala M Ramia Sidney, PA-C.   I, Lucille Passy, have  reviewed all documentation for this visit. The documentation on 02/22/21 for the exam, diagnosis, procedures, and orders are all accurate and complete.    Lucille Passy

## 2021-02-22 NOTE — Patient Instructions (Addendum)
Good to meet you today! Please go to the lab for blood work and I will send results through [facility].  Recommend Shingrix vaccine  Please monitor BP and call if consistently elevated 130s-140s/90s  F/up 1 year for CPE and fasting labs with Sam

## 2021-02-22 NOTE — Progress Notes (Deleted)
Established Patient Office Visit  Subjective:  Patient ID: Autumn Wall, female    DOB: [birth year]  Age: 60 y.o. MRN: [id number]  CC:  Chief Complaint  Patient presents with   Annual Exam        Fatigue   Breast Pain    Left side     HPI Autumn Wall presents for ***  Acute concerns: ***  Health maintenance: Lifestyle/ exercise: *** Nutrition: *** Mental health: *** Caffeine: *** Sleep: *** Substance use: *** Sexual activity: *** Immunizations: *** Colonoscopy: *** Pap: *** Mammogram: ***   Past Medical History:  Diagnosis Date   Diverticulosis    Fibroids    GERD (gastroesophageal reflux disease)    History of chicken pox    Vegetarian     History reviewed. No pertinent surgical history.  Family History  Problem Relation Age of Onset   Heart disease Mother    Brain cancer Mother        tumor   Atrial fibrillation Father    Prostate cancer Father    Colon cancer Neg Hx     Social History   Socioeconomic History   Marital status: Single    Spouse name: Not on file   Number of children: Not on file   Years of education: Not on file   Highest education level: Not on file  Occupational History   Occupation: nurse  Tobacco Use   Smoking status: Never   Smokeless tobacco: Never  Vaping Use   Vaping Use: Never used  Substance and Sexual Activity   Alcohol use: No    Comment: rarely   Drug use: No   Sexual activity: Not Currently  Other Topics Concern   Not on file  Social History Narrative   Single    No children   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     Multiple Vitamin (MULTIVITAMIN) tablet Take 1 tablet by mouth daily.     No facility-administered medications prior to visit.    Allergies  Allergen Reactions   Isovue [Iopamidol] Shortness Of Breath and Other (See Comments)    Pt states she had IV contrast 5 yrs ago and had SOB and her throat got very tight for about 30 secs.  We did w/o contrast on 05/16/17.  Pt needs full premeds in the future.  J Bohm, RTRCT   Iodinated Diagnostic Agents     ROS Review of Systems    Objective:    Physical Exam  BP (!) 165/99   Pulse 81   Temp 97.6 F (36.4 C)   Ht 5\' 5"  (1.651 m)   Wt 194 lb 3.2 oz (88.1 kg)   LMP 08/10/2012   SpO2 97%   BMI 32.32 kg/m  Wt Readings from Last 3 Encounters:  02/22/21 194 lb 3.2 oz (88.1 kg)  08/12/19 190 lb 4 oz (86.3 kg)  06/15/18 178 lb (80.7 kg)     Health Maintenance Due  Topic Date Due   COVID-19 Vaccine (1) Never done   HIV Screening  Never done  Hepatitis C Screening  Never done   COLONOSCOPY (Pts 45-55yrs Insurance coverage will need to be confirmed)  Never done   MAMMOGRAM  04/22/2018    There are no preventive care reminders to display for this patient.  Lab Results  Component Value Date   TSH 3.940 01/29/2019   Lab Results  Component Value Date   WBC 5.4 01/29/2019   HGB 15.1 (H) 01/29/2019   HCT 45.1 01/29/2019   MCV 89.8 01/29/2019   PLT 226 01/29/2019   Lab Results  Component Value Date   NA 139 01/29/2019   K 4.0 01/29/2019   CO2 24 01/29/2019   GLUCOSE 118 (H) 01/29/2019   BUN 9 01/29/2019   CREATININE 0.76 01/29/2019   ALKPHOS 119 12/01/2018   AST 15 12/01/2018   ALT 23 12/01/2018   ALBUMIN 4.7 12/01/2018   CALCIUM 9.7 01/29/2019   ANIONGAP 10 01/29/2019   No results found for: CHOL No results found for: HDL No results found for: LDLCALC No results found for: TRIG No results found for: CHOLHDL No results found for: HGBA1C    Assessment & Plan:   Problem List Items  Addressed This Visit   None   No orders of the defined types were placed in this encounter.   Follow-up: No follow-ups on file.    Jalena Vanderlinden M Lindsee Labarre, PA-C

## 2021-02-23 ENCOUNTER — Other Ambulatory Visit: Payer: Self-pay | Admitting: Physician Assistant

## 2021-02-23 DIAGNOSIS — N644 Mastodynia: Secondary | ICD-10-CM

## 2021-02-23 LAB — THYROID PANEL WITH TSH
Free Thyroxine Index: 2.5 (ref 1.4–3.8)
T3 Uptake: 28 % (ref 22–35)
T4, Total: 8.8 ug/dL (ref 5.1–11.9)
TSH: 6.35 mIU/L — ABNORMAL HIGH (ref 0.40–4.50)

## 2021-03-23 DIAGNOSIS — E782 Mixed hyperlipidemia: Secondary | ICD-10-CM | POA: Diagnosis not present

## 2021-03-23 DIAGNOSIS — R030 Elevated blood-pressure reading, without diagnosis of hypertension: Secondary | ICD-10-CM | POA: Diagnosis not present

## 2021-03-23 DIAGNOSIS — E039 Hypothyroidism, unspecified: Secondary | ICD-10-CM | POA: Diagnosis not present

## 2021-04-19 ENCOUNTER — Other Ambulatory Visit: Payer: 59

## 2021-04-24 ENCOUNTER — Other Ambulatory Visit: Payer: Self-pay

## 2021-04-24 ENCOUNTER — Ambulatory Visit
Admission: RE | Admit: 2021-04-24 | Discharge: 2021-04-24 | Disposition: A | Discharge status: Home / Self Care | Payer: 59 | Source: Ambulatory Visit | Attending: Physician Assistant | Admitting: Physician Assistant

## 2021-04-24 ENCOUNTER — Other Ambulatory Visit: Payer: 59

## 2021-04-24 ENCOUNTER — Ambulatory Visit: Admission: RE | Admit: 2021-04-24 | Payer: 59 | Source: Ambulatory Visit

## 2021-04-24 DIAGNOSIS — N644 Mastodynia: Secondary | ICD-10-CM

## 2021-04-24 DIAGNOSIS — R928 Other abnormal and inconclusive findings on diagnostic imaging of breast: Secondary | ICD-10-CM | POA: Diagnosis not present

## 2021-05-04 DIAGNOSIS — E039 Hypothyroidism, unspecified: Secondary | ICD-10-CM | POA: Diagnosis not present

## 2021-05-04 DIAGNOSIS — E782 Mixed hyperlipidemia: Secondary | ICD-10-CM | POA: Diagnosis not present

## 2021-05-08 ENCOUNTER — Other Ambulatory Visit: Payer: 59

## 2021-05-11 DIAGNOSIS — E782 Mixed hyperlipidemia: Secondary | ICD-10-CM | POA: Diagnosis not present

## 2021-05-11 DIAGNOSIS — E039 Hypothyroidism, unspecified: Secondary | ICD-10-CM | POA: Diagnosis not present

## 2021-05-11 DIAGNOSIS — R7303 Prediabetes: Secondary | ICD-10-CM | POA: Diagnosis not present

## 2021-07-03 DIAGNOSIS — Z6829 Body mass index (BMI) 29.0-29.9, adult: Secondary | ICD-10-CM | POA: Diagnosis not present

## 2021-07-03 DIAGNOSIS — Z01419 Encounter for gynecological examination (general) (routine) without abnormal findings: Secondary | ICD-10-CM | POA: Diagnosis not present

## 2021-10-05 DIAGNOSIS — E039 Hypothyroidism, unspecified: Secondary | ICD-10-CM | POA: Diagnosis not present

## 2021-12-07 DIAGNOSIS — E039 Hypothyroidism, unspecified: Secondary | ICD-10-CM | POA: Diagnosis not present

## 2021-12-07 DIAGNOSIS — R7303 Prediabetes: Secondary | ICD-10-CM | POA: Diagnosis not present

## 2021-12-07 DIAGNOSIS — E782 Mixed hyperlipidemia: Secondary | ICD-10-CM | POA: Diagnosis not present

## 2021-12-11 DIAGNOSIS — H43392 Other vitreous opacities, left eye: Secondary | ICD-10-CM | POA: Diagnosis not present

## 2021-12-11 DIAGNOSIS — H43812 Vitreous degeneration, left eye: Secondary | ICD-10-CM | POA: Diagnosis not present

## 2021-12-18 DIAGNOSIS — Z0000 Encounter for general adult medical examination without abnormal findings: Secondary | ICD-10-CM | POA: Diagnosis not present

## 2021-12-18 DIAGNOSIS — R030 Elevated blood-pressure reading, without diagnosis of hypertension: Secondary | ICD-10-CM | POA: Diagnosis not present

## 2021-12-18 DIAGNOSIS — R7303 Prediabetes: Secondary | ICD-10-CM | POA: Diagnosis not present

## 2021-12-18 DIAGNOSIS — E039 Hypothyroidism, unspecified: Secondary | ICD-10-CM | POA: Diagnosis not present

## 2021-12-18 DIAGNOSIS — E041 Nontoxic single thyroid nodule: Secondary | ICD-10-CM | POA: Diagnosis not present

## 2021-12-18 DIAGNOSIS — E782 Mixed hyperlipidemia: Secondary | ICD-10-CM | POA: Diagnosis not present

## 2021-12-19 ENCOUNTER — Ambulatory Visit
Admission: RE | Admit: 2021-12-19 | Discharge: 2021-12-19 | Disposition: A | Discharge status: Home / Self Care | Payer: 59 | Source: Ambulatory Visit

## 2021-12-19 ENCOUNTER — Other Ambulatory Visit: Payer: Self-pay

## 2021-12-19 DIAGNOSIS — E041 Nontoxic single thyroid nodule: Secondary | ICD-10-CM

## 2022-04-29 ENCOUNTER — Encounter: Payer: Self-pay | Admitting: *Deleted

## 2022-06-14 DIAGNOSIS — R7303 Prediabetes: Secondary | ICD-10-CM | POA: Diagnosis not present

## 2022-06-14 DIAGNOSIS — E782 Mixed hyperlipidemia: Secondary | ICD-10-CM | POA: Diagnosis not present

## 2022-06-14 DIAGNOSIS — E039 Hypothyroidism, unspecified: Secondary | ICD-10-CM | POA: Diagnosis not present

## 2022-06-24 DIAGNOSIS — E041 Nontoxic single thyroid nodule: Secondary | ICD-10-CM | POA: Diagnosis not present

## 2022-06-24 DIAGNOSIS — E039 Hypothyroidism, unspecified: Secondary | ICD-10-CM | POA: Diagnosis not present

## 2022-06-24 DIAGNOSIS — R030 Elevated blood-pressure reading, without diagnosis of hypertension: Secondary | ICD-10-CM | POA: Diagnosis not present

## 2022-06-24 DIAGNOSIS — E782 Mixed hyperlipidemia: Secondary | ICD-10-CM | POA: Diagnosis not present

## 2022-06-24 DIAGNOSIS — R7303 Prediabetes: Secondary | ICD-10-CM | POA: Diagnosis not present

## 2022-07-16 ENCOUNTER — Other Ambulatory Visit (HOSPITAL_COMMUNITY): Payer: Self-pay

## 2022-07-16 MED ORDER — LEVOTHYROXINE SODIUM 50 MCG PO TABS
ORAL_TABLET | ORAL | 1 refills | Status: DC
  Filled 2022-07-16: qty 90, 90d supply, fill #0
  Filled 2022-10-17: qty 90, 90d supply, fill #1

## 2022-07-17 ENCOUNTER — Other Ambulatory Visit (HOSPITAL_COMMUNITY): Payer: Self-pay

## 2022-07-18 ENCOUNTER — Encounter: Payer: Self-pay | Admitting: *Deleted

## 2022-07-19 ENCOUNTER — Other Ambulatory Visit (HOSPITAL_COMMUNITY): Payer: Self-pay

## 2022-07-22 ENCOUNTER — Other Ambulatory Visit (HOSPITAL_COMMUNITY): Payer: Self-pay

## 2022-07-22 MED ORDER — AMOXICILLIN 500 MG PO CAPS
500.0000 mg | ORAL_CAPSULE | Freq: Three times a day (TID) | ORAL | 0 refills | Status: DC
  Filled 2022-07-22: qty 21, 7d supply, fill #0

## 2022-09-13 ENCOUNTER — Other Ambulatory Visit: Payer: Self-pay | Admitting: Obstetrics and Gynecology

## 2022-09-13 DIAGNOSIS — Z1231 Encounter for screening mammogram for malignant neoplasm of breast: Secondary | ICD-10-CM

## 2022-09-16 DIAGNOSIS — Z01419 Encounter for gynecological examination (general) (routine) without abnormal findings: Secondary | ICD-10-CM | POA: Diagnosis not present

## 2022-09-16 DIAGNOSIS — Z1151 Encounter for screening for human papillomavirus (HPV): Secondary | ICD-10-CM | POA: Diagnosis not present

## 2022-09-16 DIAGNOSIS — Z124 Encounter for screening for malignant neoplasm of cervix: Secondary | ICD-10-CM | POA: Diagnosis not present

## 2022-09-27 ENCOUNTER — Ambulatory Visit
Admission: RE | Admit: 2022-09-27 | Discharge: 2022-09-27 | Disposition: A | Discharge status: Home / Self Care | Payer: 59 | Source: Ambulatory Visit | Attending: Obstetrics and Gynecology | Admitting: Obstetrics and Gynecology

## 2022-09-27 DIAGNOSIS — Z1231 Encounter for screening mammogram for malignant neoplasm of breast: Secondary | ICD-10-CM | POA: Diagnosis not present

## 2022-12-20 DIAGNOSIS — E039 Hypothyroidism, unspecified: Secondary | ICD-10-CM | POA: Diagnosis not present

## 2022-12-20 DIAGNOSIS — E782 Mixed hyperlipidemia: Secondary | ICD-10-CM | POA: Diagnosis not present

## 2022-12-20 DIAGNOSIS — R7303 Prediabetes: Secondary | ICD-10-CM | POA: Diagnosis not present

## 2022-12-20 DIAGNOSIS — Z0000 Encounter for general adult medical examination without abnormal findings: Secondary | ICD-10-CM | POA: Diagnosis not present

## 2023-01-07 ENCOUNTER — Other Ambulatory Visit (HOSPITAL_COMMUNITY): Payer: Self-pay

## 2023-01-07 DIAGNOSIS — E039 Hypothyroidism, unspecified: Secondary | ICD-10-CM | POA: Diagnosis not present

## 2023-01-07 DIAGNOSIS — Z0000 Encounter for general adult medical examination without abnormal findings: Secondary | ICD-10-CM | POA: Diagnosis not present

## 2023-01-07 DIAGNOSIS — R030 Elevated blood-pressure reading, without diagnosis of hypertension: Secondary | ICD-10-CM | POA: Diagnosis not present

## 2023-01-07 DIAGNOSIS — E782 Mixed hyperlipidemia: Secondary | ICD-10-CM | POA: Diagnosis not present

## 2023-01-07 MED ORDER — LEVOTHYROXINE SODIUM 50 MCG PO TABS
50.0000 ug | ORAL_TABLET | Freq: Every morning | ORAL | 1 refills | Status: DC
  Filled 2023-01-13 – 2023-01-15 (×4): qty 90, 90d supply, fill #0

## 2023-01-13 ENCOUNTER — Other Ambulatory Visit: Payer: Self-pay

## 2023-01-13 ENCOUNTER — Other Ambulatory Visit (HOSPITAL_COMMUNITY): Payer: Self-pay

## 2023-01-15 ENCOUNTER — Other Ambulatory Visit (HOSPITAL_COMMUNITY): Payer: Self-pay

## 2023-01-16 ENCOUNTER — Other Ambulatory Visit (HOSPITAL_COMMUNITY): Payer: Self-pay

## 2023-05-19 ENCOUNTER — Other Ambulatory Visit (HOSPITAL_COMMUNITY): Payer: Self-pay

## 2023-05-19 MED ORDER — LEVOTHYROXINE SODIUM 50 MCG PO TABS
50.0000 ug | ORAL_TABLET | Freq: Every morning | ORAL | 1 refills | Status: DC
  Filled 2023-05-19: qty 90, 90d supply, fill #0

## 2023-05-20 ENCOUNTER — Other Ambulatory Visit (HOSPITAL_COMMUNITY): Payer: Self-pay

## 2023-05-24 ENCOUNTER — Other Ambulatory Visit (HOSPITAL_COMMUNITY): Payer: Self-pay

## 2023-07-14 ENCOUNTER — Ambulatory Visit (INDEPENDENT_AMBULATORY_CARE_PROVIDER_SITE_OTHER): Payer: 59 | Admitting: Physician Assistant

## 2023-07-14 VITALS — BP 128/74 | HR 67 | Temp 97.5°F | Ht 67.0 in | Wt 190.6 lb

## 2023-07-14 DIAGNOSIS — E039 Hypothyroidism, unspecified: Secondary | ICD-10-CM

## 2023-07-14 DIAGNOSIS — Z13220 Encounter for screening for lipoid disorders: Secondary | ICD-10-CM | POA: Diagnosis not present

## 2023-07-14 DIAGNOSIS — E559 Vitamin D deficiency, unspecified: Secondary | ICD-10-CM

## 2023-07-14 DIAGNOSIS — Z136 Encounter for screening for cardiovascular disorders: Secondary | ICD-10-CM | POA: Diagnosis not present

## 2023-07-14 LAB — COMPREHENSIVE METABOLIC PANEL
ALT: 30 U/L (ref 0–35)
AST: 23 U/L (ref 0–37)
Albumin: 4.3 g/dL (ref 3.5–5.2)
Alkaline Phosphatase: 123 U/L — ABNORMAL HIGH (ref 39–117)
BUN: 12 mg/dL (ref 6–23)
CO2: 25 meq/L (ref 19–32)
Calcium: 9.5 mg/dL (ref 8.4–10.5)
Chloride: 102 meq/L (ref 96–112)
Creatinine, Ser: 0.66 mg/dL (ref 0.40–1.20)
GFR: 95.27 mL/min (ref >60.00)
Glucose, Bld: 136 mg/dL — ABNORMAL HIGH (ref 70–99)
Potassium: 4.2 meq/L (ref 3.5–5.1)
Sodium: 138 meq/L (ref 135–145)
Total Bilirubin: 0.4 mg/dL (ref 0.2–1.2)
Total Protein: 7.3 g/dL (ref 6.0–8.3)

## 2023-07-14 LAB — CBC WITH DIFFERENTIAL/PLATELET
Basophils Absolute: 0 10*3/uL (ref 0.0–0.1)
Basophils Relative: 0.6 % (ref 0.0–3.0)
Eosinophils Absolute: 0.2 10*3/uL (ref 0.0–0.7)
Eosinophils Relative: 4.8 % (ref 0.0–5.0)
HCT: 42.9 % (ref 36.0–46.0)
Hemoglobin: 14.4 g/dL (ref 12.0–15.0)
Lymphocytes Relative: 20.9 % (ref 12.0–46.0)
Lymphs Abs: 1.1 10*3/uL (ref 0.7–4.0)
MCHC: 33.5 g/dL (ref 30.0–36.0)
MCV: 93.9 fL (ref 78.0–100.0)
Monocytes Absolute: 0.4 10*3/uL (ref 0.1–1.0)
Monocytes Relative: 8.4 % (ref 3.0–12.0)
Neutro Abs: 3.3 10*3/uL (ref 1.4–7.7)
Neutrophils Relative %: 65.3 % (ref 43.0–77.0)
Platelets: 219 10*3/uL (ref 150.0–400.0)
RBC: 4.57 Mil/uL (ref 3.87–5.11)
RDW: 14.1 % (ref 11.5–15.5)
WBC: 5.1 10*3/uL (ref 4.0–10.5)

## 2023-07-14 LAB — TSH: TSH: 2.03 u[IU]/mL (ref 0.35–5.50)

## 2023-07-14 LAB — LIPID PANEL
Cholesterol: 209 mg/dL — ABNORMAL HIGH (ref 0–200)
HDL: 43.5 mg/dL (ref >39.00)
LDL Cholesterol: 95 mg/dL (ref 0–99)
NonHDL: 165.24
Total CHOL/HDL Ratio: 5
Triglycerides: 351 mg/dL — ABNORMAL HIGH (ref 0.0–149.0)
VLDL: 70.2 mg/dL — ABNORMAL HIGH (ref 0.0–40.0)

## 2023-07-14 LAB — VITAMIN D 25 HYDROXY (VIT D DEFICIENCY, FRACTURES): VITD: 53.4 ng/mL (ref 30.00–100.00)

## 2023-07-14 NOTE — Progress Notes (Signed)
Autumn Wall is a 62 y.o. female here for a follow-up of a pre-existing problem. History of Present Illness:   Chief Complaint  Patient presents with   Follow-up   HPI  Hypothyroidism: Managed/Compliant with 50 mcg Levothyroxine. Reports that her fatigue is improved.  Notes that non-toxic single thyroid nodule found in May 2023.   Hyperlipidemia: Reports that her cholesterol was elevated the last time her lipid panel was done, and since that finding she has been cutting out salt and high cholesterol foods.  Lab Results  Component Value Date   CHOL 215 (H) 02/22/2021   HDL 45.60 02/22/2021   LDLCALC 130 (H) 02/22/2021   TRIG 196.0 (H) 02/22/2021   CHOLHDL 5 02/22/2021   Vitamin-D Deficiency: Would like her Vitamin-D checked today.  Takes multi-vitamin daily.   Past Medical History:  Diagnosis Date   Diverticulosis    Fibroids    GERD (gastroesophageal reflux disease)    History of chicken pox    Vegetarian     Social History   Tobacco Use   Smoking status: Never   Smokeless tobacco: Never  Vaping Use   Vaping status: Never Used  Substance Use Topics   Alcohol use: No    Comment: rarely   Drug use: No   History reviewed. No pertinent surgical history. Family History  Problem Relation Age of Onset   Heart disease Mother    Brain cancer Mother        tumor   Atrial fibrillation Father    Prostate cancer Father    Colon cancer Neg Hx    Allergies  Allergen Reactions   Isovue [Iopamidol] Shortness Of Breath and Other (See Comments)    Pt states she had IV contrast 5 yrs ago and had SOB and her throat got very tight for about 30 secs.  We did w/o contrast on 05/16/17.  Pt needs full premeds in the future.  J Bohm, RTRCT   Iodinated Contrast Media    Current Medications:   Current Outpatient Medications:    amoxicillin (AMOXIL) 500 MG capsule, Take 1 capsule (500 mg total) by mouth 3 (three) times daily for 7 days, Disp: 21 capsule, Rfl: 0    Bioflavonoid Products (VITAMIN C) CHEW, Chew 1 tablet by mouth daily., Disp: , Rfl:    Cyanocobalamin (VITAMIN B-12 CR PO), Vitamin B12, Disp: , Rfl:    levothyroxine (SYNTHROID) 50 MCG tablet, Take 1 tablet (50 mcg total) by mouth in the morning on an empty stomach once daily., Disp: 90 tablet, Rfl: 1   levothyroxine (SYNTHROID) 50 MCG tablet, Take 1 tablet by mouth once daily in the morning on an empty stomach, Disp: 90 tablet, Rfl: 1   Multiple Vitamin (MULTIVITAMIN) tablet, Take 1 tablet by mouth daily., Disp: , Rfl:    NON FORMULARY, Take 2 capsules by mouth daily. Marshmallow root, Disp: , Rfl:    VITAMIN D, CHOLECALCIFEROL, PO, Take by mouth., Disp: , Rfl:   Review of Systems:   ROS See pertinent positives and negatives as per the HPI.  Vitals:   Vitals:   07/14/23 0923  BP: 128/74  Pulse: 67  Temp: (!) 97.5 F (36.4 C)  TempSrc: Temporal  SpO2: 98%  Weight: 190 lb 9.6 oz (86.5 kg)  Height: 5\' 7"  (1.702 m)     Body mass index is 29.85 kg/m.  Physical Exam:   Physical Exam Vitals and nursing note reviewed.  Constitutional:      General: She is not in  acute distress.    Appearance: She is well-developed. She is not ill-appearing or toxic-appearing.  Cardiovascular:     Rate and Rhythm: Normal rate and regular rhythm.     Pulses: Normal pulses.     Heart sounds: Normal heart sounds, S1 normal and S2 normal.  Pulmonary:     Effort: Pulmonary effort is normal.     Breath sounds: Normal breath sounds.  Skin:    General: Skin is warm and dry.  Neurological:     Mental Status: She is alert.     GCS: GCS eye subscore is 4. GCS verbal subscore is 5. GCS motor subscore is 6.  Psychiatric:        Speech: Speech normal.        Behavior: Behavior normal. Behavior is cooperative.     Assessment and Plan:   Acquired hypothyroidism Update TSH and adjust levothyroxine 50 mcg daily accordingly  Encounter for lipid screening for cardiovascular disease Update lipid panel  and make recommendations  Vitamin D deficiency Update vitamin D and provide recommendations  I,Emily Lagle,acting as a scribe for [employer], PA.,have documented all relevant documentation on the behalf of Jarold Motto, PA,as directed by  Jarold Motto, PA while in the presence of Jarold Motto, [place].  I, Jarold Motto, [place], have reviewed all documentation for this visit. The documentation on 07/14/23 for the exam, diagnosis, procedures, and orders are all accurate and complete.  Jarold Motto, PA-C

## 2023-07-15 ENCOUNTER — Other Ambulatory Visit: Payer: Self-pay | Admitting: Physician Assistant

## 2023-07-18 ENCOUNTER — Encounter: Payer: Self-pay | Admitting: Physician Assistant

## 2023-07-18 NOTE — Telephone Encounter (Signed)
Please see pt msg as FYI 

## 2023-08-25 ENCOUNTER — Other Ambulatory Visit: Payer: Self-pay | Admitting: Physician Assistant

## 2023-08-25 ENCOUNTER — Other Ambulatory Visit (HOSPITAL_BASED_OUTPATIENT_CLINIC_OR_DEPARTMENT_OTHER): Payer: Self-pay

## 2023-08-25 MED ORDER — LEVOTHYROXINE SODIUM 50 MCG PO TABS
50.0000 ug | ORAL_TABLET | Freq: Every morning | ORAL | 1 refills | Status: DC
  Filled 2023-08-25: qty 90, 90d supply, fill #0
  Filled 2023-11-19: qty 90, 90d supply, fill #1

## 2023-08-25 NOTE — Telephone Encounter (Signed)
Copied from CRM [id number]. Topic: Clinical - Medication Refill >> Aug 25, 2023  8:36 AM Fuller Mandril wrote: Most Recent Primary Care Visit:  Provider: Jarold Motto  Department: [facility]  Visit Type: OFFICE VISIT  Date: 07/14/2023  Medication: levothyroxine (SYNTHROID) 50 MCG tablet  Has the patient contacted their pharmacy? No - Rx written by previous provider and she know they will reach out to wrong provider.  (Agent: If no, request that the patient contact the pharmacy for the refill. If patient does not wish to contact the pharmacy document the reason why and proceed with request.) (Agent: If yes, when and what did the pharmacy advise?)  Is this the correct pharmacy for this prescription? Yes If no, delete pharmacy and type the correct one.  This is the patient's preferred pharmacy:   [facility] [address] [place] [address] Phone: [phone] Fax: [phone]   Has the prescription been filled recently? No  Is the patient out of the medication? Yes  Has the patient been seen for an appointment in the last year OR does the patient have an upcoming appointment? Yes  Can we respond through MyChart? Yes  Agent: Please be advised that Rx refills may take up to 3 business days. We ask that you follow-up with your pharmacy.

## 2023-11-07 DIAGNOSIS — Z01419 Encounter for gynecological examination (general) (routine) without abnormal findings: Secondary | ICD-10-CM | POA: Diagnosis not present

## 2023-11-07 DIAGNOSIS — Z1151 Encounter for screening for human papillomavirus (HPV): Secondary | ICD-10-CM | POA: Diagnosis not present

## 2023-11-07 DIAGNOSIS — Z124 Encounter for screening for malignant neoplasm of cervix: Secondary | ICD-10-CM | POA: Diagnosis not present

## 2023-11-13 LAB — HM PAP SMEAR: HPV, high-risk: NEGATIVE

## 2023-11-19 ENCOUNTER — Other Ambulatory Visit: Payer: Self-pay

## 2024-01-28 ENCOUNTER — Encounter: Payer: Self-pay | Admitting: Physician Assistant

## 2024-01-28 ENCOUNTER — Ambulatory Visit (INDEPENDENT_AMBULATORY_CARE_PROVIDER_SITE_OTHER): Admitting: Physician Assistant

## 2024-01-28 ENCOUNTER — Ambulatory Visit: Payer: Self-pay | Admitting: Physician Assistant

## 2024-01-28 VITALS — BP 158/100 | HR 56 | Temp 97.2°F | Ht 66.0 in | Wt 188.2 lb

## 2024-01-28 DIAGNOSIS — E669 Obesity, unspecified: Secondary | ICD-10-CM | POA: Diagnosis not present

## 2024-01-28 DIAGNOSIS — E039 Hypothyroidism, unspecified: Secondary | ICD-10-CM

## 2024-01-28 DIAGNOSIS — R030 Elevated blood-pressure reading, without diagnosis of hypertension: Secondary | ICD-10-CM | POA: Diagnosis not present

## 2024-01-28 DIAGNOSIS — E559 Vitamin D deficiency, unspecified: Secondary | ICD-10-CM

## 2024-01-28 DIAGNOSIS — Z0000 Encounter for general adult medical examination without abnormal findings: Secondary | ICD-10-CM | POA: Diagnosis not present

## 2024-01-28 DIAGNOSIS — K625 Hemorrhage of anus and rectum: Secondary | ICD-10-CM | POA: Insufficient documentation

## 2024-01-28 LAB — CBC WITH DIFFERENTIAL/PLATELET
Basophils Absolute: 0 10*3/uL (ref 0.0–0.1)
Basophils Relative: 0.6 % (ref 0.0–3.0)
Eosinophils Absolute: 0.3 10*3/uL (ref 0.0–0.7)
Eosinophils Relative: 4.3 % (ref 0.0–5.0)
HCT: 43.5 % (ref 36.0–46.0)
Hemoglobin: 14.6 g/dL (ref 12.0–15.0)
Lymphocytes Relative: 24.4 % (ref 12.0–46.0)
Lymphs Abs: 1.4 10*3/uL (ref 0.7–4.0)
MCHC: 33.7 g/dL (ref 30.0–36.0)
MCV: 90.3 fl (ref 78.0–100.0)
Monocytes Absolute: 0.5 10*3/uL (ref 0.1–1.0)
Monocytes Relative: 9.2 % (ref 3.0–12.0)
Neutro Abs: 3.6 10*3/uL (ref 1.4–7.7)
Neutrophils Relative %: 61.5 % (ref 43.0–77.0)
Platelets: 224 10*3/uL (ref 150.0–400.0)
RBC: 4.81 Mil/uL (ref 3.87–5.11)
RDW: 14.1 % (ref 11.5–15.5)
WBC: 5.9 10*3/uL (ref 4.0–10.5)

## 2024-01-28 LAB — COMPREHENSIVE METABOLIC PANEL WITH GFR
ALT: 25 U/L (ref 0–35)
AST: 19 U/L (ref 0–37)
Albumin: 4.4 g/dL (ref 3.5–5.2)
Alkaline Phosphatase: 110 U/L (ref 39–117)
BUN: 12 mg/dL (ref 6–23)
CO2: 25 meq/L (ref 19–32)
Calcium: 9.7 mg/dL (ref 8.4–10.5)
Chloride: 103 meq/L (ref 96–112)
Creatinine, Ser: 0.73 mg/dL (ref 0.40–1.20)
GFR: 88.98 mL/min (ref >60.00)
Glucose, Bld: 103 mg/dL — ABNORMAL HIGH (ref 70–99)
Potassium: 4 meq/L (ref 3.5–5.1)
Sodium: 138 meq/L (ref 135–145)
Total Bilirubin: 0.5 mg/dL (ref 0.2–1.2)
Total Protein: 6.9 g/dL (ref 6.0–8.3)

## 2024-01-28 LAB — LIPID PANEL
Cholesterol: 195 mg/dL (ref 0–200)
HDL: 44.6 mg/dL (ref >39.00)
LDL Cholesterol: 100 mg/dL — ABNORMAL HIGH (ref 0–99)
NonHDL: 150.1
Total CHOL/HDL Ratio: 4
Triglycerides: 251 mg/dL — ABNORMAL HIGH (ref 0.0–149.0)
VLDL: 50.2 mg/dL — ABNORMAL HIGH (ref 0.0–40.0)

## 2024-01-28 LAB — VITAMIN D 25 HYDROXY (VIT D DEFICIENCY, FRACTURES): VITD: 58.64 ng/mL (ref 30.00–100.00)

## 2024-01-28 LAB — TSH: TSH: 4.55 u[IU]/mL (ref 0.35–5.50)

## 2024-01-28 NOTE — Progress Notes (Signed)
 Subjective:    Autumn Wall is a 63 y.o. female and is here for a comprehensive physical exam.  HPI  There are no preventive care reminders to display for this patient.  Acute Concerns: Elevated blood pressure Currently taking no medication. At home blood pressure readings are: not checked. Patient denies chest pain, SOB, blurred vision, dizziness, unusual headaches, lower leg swelling. Denies excessive caffeine intake, stimulant usage, excessive alcohol intake, or increase in salt consumption.  BP Readings from Last 3 Encounters:  01/28/24 (!) 158/100  07/14/23 128/74  02/22/21 (!) 165/99   She reports that she did not go to bed since being off work -- she suspects that this is causing her elevated blood pressure.   Chronic Issues: Hypothyroidism Pt is on Levothyroxine  50 mcg daily. Good compliance and tolerance. Hypothyroidism is well-managed. No acute concerns reported today.  GERD  Pt takes marshmallow root. GERD is well-managed. No acute concerns reported today.  Health Maintenance: Immunizations -- UTD Colonoscopy -- documentation is 2021 but I cannot verify report -- I have sent MyChart message to patient inquiring about this Mammogram -- Last done 09/27/2022 and there were no findings suspicious for malignancy. Last due 09/28/2023. PAP --UpToDate with gynecology -- requesting records Bone Density -- N/a Diet -- Overall healthy diet. Exercise -- Regular exercise habits.  Sleep habits -- Good sleeping habits, no concerns reported. Pt works night shifts as a Engineer, civil (consulting). Mood -- Stable.  UTD with dentist? - UTD UTD with eye doctor? - Last seen 1.5 yrs ago per pt  Weight history: Wt Readings from Last 10 Encounters:  01/28/24 188 lb 4 oz (85.4 kg)  07/14/23 190 lb 9.6 oz (86.5 kg)  02/22/21 194 lb 3.2 oz (88.1 kg)  08/12/19 190 lb 4 oz (86.3 kg)  06/15/18 178 lb (80.7 kg)  05/21/18 180 lb (81.6 kg)  05/07/18 180 lb (81.6 kg)  05/05/18 180 lb (81.6 kg)   09/27/15 162 lb (73.5 kg)  08/24/12 162 lb (73.5 kg)   Body mass index is 30.38 kg/m. Patient's last menstrual period was 08/10/2012.  Alcohol use:  reports no history of alcohol use.  Tobacco use:  Tobacco Use: Low Risk  (01/28/2024)   Patient History    Smoking Tobacco Use: Never    Smokeless Tobacco Use: Never    Passive Exposure: Not on file   Eligible for lung cancer screening? no     01/28/2024    9:05 AM  Depression screen PHQ 2/9  Decreased Interest 0  Down, Depressed, Hopeless 0  PHQ - 2 Score 0     Other providers/specialists: Patient Care Team: Job Lukes, [place] as PCP - General (Physician Assistant)    PMHx, SurgHx, SocialHx, Medications, and Allergies were reviewed in the Visit Navigator and updated as appropriate.   Past Medical History:  Diagnosis Date   Allergy    Diverticulosis    Fibroids    GERD (gastroesophageal reflux disease)    History of chicken pox    Thyroid  disease    2 thyroid  noduled   Vegetarian     No past surgical history on file.   Family History  Problem Relation Age of Onset   Heart disease Mother    Brain cancer Mother        tumor   Atrial fibrillation Father    Prostate cancer Father    Colon cancer Neg Hx     Social History   Tobacco Use   Smoking status: Never   Smokeless  tobacco: Never  Vaping Use   Vaping status: Never Used  Substance Use Topics   Alcohol use: No    Comment: none   Drug use: No    Review of Systems:   Review of Systems  Constitutional:  Negative for chills, fever, malaise/fatigue and weight loss.  HENT:  Negative for hearing loss, sinus pain and sore throat.   Respiratory:  Negative for cough and hemoptysis.   Cardiovascular:  Negative for chest pain, palpitations, leg swelling and PND.  Gastrointestinal:  Negative for abdominal pain, constipation, diarrhea, heartburn, nausea and vomiting.  Genitourinary:  Negative for dysuria, frequency and urgency.  Musculoskeletal:   Negative for back pain, myalgias and neck pain.  Skin:  Negative for itching and rash.  Neurological:  Negative for dizziness, tingling, seizures and headaches.  Endo/Heme/Allergies:  Negative for polydipsia.  Psychiatric/Behavioral:  Negative for depression. The patient is not nervous/anxious.    Objective:   BP (!) 158/100 (BP Location: Left Arm, Patient Position: Sitting, Cuff Size: Normal)   Pulse (!) 56   Temp (!) 97.2 F (36.2 C) (Temporal)   Ht 5' 6 (1.676 m)   Wt 188 lb 4 oz (85.4 kg)   LMP 08/10/2012   SpO2 99%   BMI 30.38 kg/m  Body mass index is 30.38 kg/m.   General Appearance:    Alert, cooperative, no distress, appears stated age  Head:    Normocephalic, without obvious abnormality, atraumatic  Eyes:    PERRL, conjunctiva/corneas clear, EOM's intact, fundi    benign, both eyes  Ears:    Normal TM's and external ear canals, both ears  Nose:   Nares normal, septum midline, mucosa normal, no drainage    or sinus tenderness  Throat:   Lips, mucosa, and tongue normal; teeth and gums normal  Neck:   Supple, symmetrical, trachea midline, no adenopathy;    thyroid :  no enlargement/tenderness/nodules; no carotid   bruit or JVD  Back:     Symmetric, no curvature, ROM normal, no CVA tenderness  Lungs:     Clear to auscultation bilaterally, respirations unlabored  Chest Wall:    No tenderness or deformity   Heart:    Regular rate and rhythm, S1 and S2 normal, no murmur, rub or gallop  Breast Exam:    Deferred  Abdomen:     Soft, non-tender, bowel sounds active all four quadrants,    no masses, no organomegaly  Genitalia:    Deferred  Extremities:   Extremities normal, atraumatic, no cyanosis or edema  Pulses:   2+ and symmetric all extremities  Skin:   Skin color, texture, turgor normal, no rashes or lesions  Lymph nodes:   Cervical, supraclavicular, and axillary nodes normal  Neurologic:   CNII-XII intact, normal strength, sensation and reflexes    throughout     Assessment/Plan:   Routine physical examination Today patient counseled on age appropriate routine health concerns for screening and prevention, each reviewed and up to date or declined. Immunizations reviewed and up to date or declined. Labs ordered and reviewed. Risk factors for depression reviewed and negative. Hearing function and visual acuity are intact. ADLs screened and addressed as needed. Functional ability and level of safety reviewed and appropriate. Education, counseling and referrals performed based on assessed risks today. Patient provided with a copy of personalized plan for preventive services.  Acquired hypothyroidism Update TSH and adjust levothyroxine  50 mcg daily  Vitamin D  deficiency Update vitamin D  and provide recommendations   Elevated blood pressure  reading Above goal today No evidence of end-organ damage on my exam Recommend patient monitor home blood pressure at least a few times weekly If home monitoring shows consistent elevation, or any symptom(s) develop, recommend reach out to us  for further advice on next steps  Obesity, unspecified class, unspecified obesity type, unspecified whether serious comorbidity present Continue efforts at healthy lifestyle  I, Lavern Simmers, acting as a Neurosurgeon for [employer], [place]., have documented all relevant documentation on the behalf of Lucie Buttner, [place], as directed by Lucie Buttner, PA while in the presence of Lucie Buttner, [place].  I, Lucie Buttner, [place], have reviewed all documentation for this visit. The documentation on 01/28/24 for the exam, diagnosis, procedures, and orders are all accurate and complete.  Lucie Buttner, PA-C [facility] [place]

## 2024-01-28 NOTE — Patient Instructions (Signed)
 It was great to see you!  Please go to the lab for blood work.   Our office will call you with your results unless you have chosen to receive results via MyChart.  If your blood work is normal we will follow-up each year for physicals and as scheduled for chronic medical problems.  If anything is abnormal we will treat accordingly and get you in for a follow-up.  Take care,  Lelon Mast

## 2024-02-02 ENCOUNTER — Other Ambulatory Visit: Payer: Self-pay | Admitting: Physician Assistant

## 2024-02-03 ENCOUNTER — Other Ambulatory Visit (HOSPITAL_BASED_OUTPATIENT_CLINIC_OR_DEPARTMENT_OTHER): Payer: Self-pay

## 2024-02-03 MED ORDER — LEVOTHYROXINE SODIUM 50 MCG PO TABS
50.0000 ug | ORAL_TABLET | Freq: Every morning | ORAL | 1 refills | Status: DC
  Filled 2024-02-03: qty 90, 90d supply, fill #0
  Filled 2024-05-17: qty 90, 90d supply, fill #1

## 2024-05-10 ENCOUNTER — Other Ambulatory Visit (HOSPITAL_BASED_OUTPATIENT_CLINIC_OR_DEPARTMENT_OTHER): Payer: Self-pay

## 2024-05-10 MED ORDER — FLUZONE 0.5 ML IM SUSY
0.5000 mL | PREFILLED_SYRINGE | Freq: Once | INTRAMUSCULAR | 0 refills | Status: AC
  Filled 2024-05-10: qty 0.5, 1d supply, fill #0

## 2024-05-14 ENCOUNTER — Other Ambulatory Visit: Payer: Self-pay | Admitting: Obstetrics and Gynecology

## 2024-05-14 DIAGNOSIS — Z1231 Encounter for screening mammogram for malignant neoplasm of breast: Secondary | ICD-10-CM

## 2024-06-17 ENCOUNTER — Ambulatory Visit

## 2024-07-16 ENCOUNTER — Ambulatory Visit
Admission: RE | Admit: 2024-07-16 | Discharge: 2024-07-16 | Disposition: A | Source: Ambulatory Visit | Attending: Obstetrics and Gynecology | Admitting: Obstetrics and Gynecology

## 2024-07-16 ENCOUNTER — Ambulatory Visit: Admitting: Physician Assistant

## 2024-07-16 DIAGNOSIS — Z1231 Encounter for screening mammogram for malignant neoplasm of breast: Secondary | ICD-10-CM

## 2024-07-20 ENCOUNTER — Ambulatory Visit: Admitting: Physician Assistant

## 2024-07-20 ENCOUNTER — Encounter: Payer: Self-pay | Admitting: Physician Assistant

## 2024-07-20 VITALS — BP 110/80 | HR 68 | Temp 97.2°F | Ht 66.0 in | Wt 185.4 lb

## 2024-07-20 DIAGNOSIS — R350 Frequency of micturition: Secondary | ICD-10-CM

## 2024-07-20 DIAGNOSIS — E039 Hypothyroidism, unspecified: Secondary | ICD-10-CM

## 2024-07-20 DIAGNOSIS — E785 Hyperlipidemia, unspecified: Secondary | ICD-10-CM | POA: Diagnosis not present

## 2024-07-20 DIAGNOSIS — E559 Vitamin D deficiency, unspecified: Secondary | ICD-10-CM | POA: Diagnosis not present

## 2024-07-20 LAB — CBC WITH DIFFERENTIAL/PLATELET
Basophils Absolute: 0 K/uL (ref 0.0–0.1)
Basophils Relative: 0.6 % (ref 0.0–3.0)
Eosinophils Absolute: 0.2 K/uL (ref 0.0–0.7)
Eosinophils Relative: 4.7 % (ref 0.0–5.0)
HCT: 43.1 % (ref 36.0–46.0)
Hemoglobin: 14.9 g/dL (ref 12.0–15.0)
Lymphocytes Relative: 24.3 % (ref 12.0–46.0)
Lymphs Abs: 1.2 K/uL (ref 0.7–4.0)
MCHC: 34.5 g/dL (ref 30.0–36.0)
MCV: 91.2 fl (ref 78.0–100.0)
Monocytes Absolute: 0.5 K/uL (ref 0.1–1.0)
Monocytes Relative: 10.1 % (ref 3.0–12.0)
Neutro Abs: 3 K/uL (ref 1.4–7.7)
Neutrophils Relative %: 60.3 % (ref 43.0–77.0)
Platelets: 227 K/uL (ref 150.0–400.0)
RBC: 4.73 Mil/uL (ref 3.87–5.11)
RDW: 13.7 % (ref 11.5–15.5)
WBC: 4.9 K/uL (ref 4.0–10.5)

## 2024-07-20 LAB — COMPREHENSIVE METABOLIC PANEL WITH GFR
ALT: 27 U/L (ref 3–35)
AST: 21 U/L (ref 5–37)
Albumin: 4.4 g/dL (ref 3.5–5.2)
Alkaline Phosphatase: 121 U/L — ABNORMAL HIGH (ref 39–117)
BUN: 12 mg/dL (ref 6–23)
CO2: 27 meq/L (ref 19–32)
Calcium: 9.6 mg/dL (ref 8.4–10.5)
Chloride: 103 meq/L (ref 96–112)
Creatinine, Ser: 0.69 mg/dL (ref 0.40–1.20)
GFR: 93.59 mL/min (ref >60.00)
Glucose, Bld: 101 mg/dL — ABNORMAL HIGH (ref 70–99)
Potassium: 4 meq/L (ref 3.5–5.1)
Sodium: 138 meq/L (ref 135–145)
Total Bilirubin: 0.6 mg/dL (ref 0.2–1.2)
Total Protein: 7.4 g/dL (ref 6.0–8.3)

## 2024-07-20 LAB — URINALYSIS, ROUTINE W REFLEX MICROSCOPIC
Bilirubin Urine: NEGATIVE
Hgb urine dipstick: NEGATIVE
Ketones, ur: NEGATIVE
Leukocytes,Ua: NEGATIVE
Nitrite: NEGATIVE
RBC / HPF: NONE SEEN (ref 0–?)
Specific Gravity, Urine: >=1.03 — AB (ref 1.000–1.030)
Total Protein, Urine: NEGATIVE
Urine Glucose: NEGATIVE
Urobilinogen, UA: 0.2 (ref 0.0–1.0)
pH: 6 (ref 5.0–8.0)

## 2024-07-20 LAB — LIPID PANEL
Cholesterol: 197 mg/dL (ref 28–200)
HDL: 46 mg/dL (ref >39.00)
LDL Cholesterol: 108 mg/dL — ABNORMAL HIGH (ref 10–99)
NonHDL: 151.26
Total CHOL/HDL Ratio: 4
Triglycerides: 218 mg/dL — ABNORMAL HIGH (ref 10.0–149.0)
VLDL: 43.6 mg/dL — ABNORMAL HIGH (ref 0.0–40.0)

## 2024-07-20 LAB — VITAMIN D 25 HYDROXY (VIT D DEFICIENCY, FRACTURES): VITD: 57.4 ng/mL (ref 30.00–100.00)

## 2024-07-20 LAB — TSH: TSH: 2.84 u[IU]/mL (ref 0.35–5.50)

## 2024-07-20 NOTE — Progress Notes (Signed)
 History of Present Illness:   Chief Complaint  Patient presents with   Medical Management of Chronic Issues    Pt here for 6 month f/u Hypothyroidism    Discussed the use of AI scribe software for clinical note transcription with the patient, who gave verbal consent to proceed.  History of Present Illness   Autumn Wall is a 62 year old female who presents with concerns about increased urination and follow up of chronic medical issues.  She reports several months of increased urination. She attributes this to switching to drinking only water. She denies increased thirst or significant weight change associated with this.  She notes a 3-pound weight loss since June but feels she has lost more fat from her arms, wrists, and neck while working on fitness. She is concerned that her thyroid  nodules may be affecting her weight.  She takes 50 mcg levothyroxine  for her thyroid  condition. Her last TSH was 4.5, which she was told is at the upper end of normal. She is worried that her thyroid  nodules could become problematic or malignant.  She works full-time in a adult nurse job and is under financial stress that limits retirement options. 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        Past Medical History:  Diagnosis Date   Allergy    Diverticulosis    Fibroids    GERD (gastroesophageal reflux disease)    History of chicken pox    Thyroid  disease    2 thyroid  noduled   Vegetarian      Social History[1]  No past surgical history on file.  Family History  Problem Relation Age of Onset   Heart disease Mother    Brain cancer Mother        tumor   Atrial fibrillation Father    Prostate cancer Father    Colon cancer Neg Hx     Allergies[2]  Current Medications:  Current Medications[3]   Review of Systems:   Negative unless otherwise specified per HPI.  Vitals:   Vitals:   07/20/24 0957  BP: 110/80  Pulse: 68   Temp: (!) 97.2 F (36.2 C)  TempSrc: Temporal  SpO2: 98%  Weight: 185 lb 6.1 oz (84.1 kg)  Height: 5' 6 (1.676 m)     Body mass index is 29.92 kg/m.  Physical Exam:   Physical Exam Vitals and nursing note reviewed.  Constitutional:      General: She is not in acute distress.    Appearance: She is well-developed. She is not ill-appearing or toxic-appearing.  Cardiovascular:     Rate and Rhythm: Normal rate and regular rhythm.     Pulses: Normal pulses.     Heart sounds: Normal heart sounds, S1 normal and S2 normal.  Pulmonary:     Effort: Pulmonary effort is normal.     Breath sounds: Normal breath sounds.  Skin:    General: Skin is warm and dry.  Neurological:     Mental Status: She is alert.     GCS: GCS eye subscore is 4. GCS verbal subscore is 5. GCS motor subscore is 6.  Psychiatric:        Speech: Speech normal.        Behavior: Behavior normal. Behavior is cooperative.     Assessment and Plan:   Assessment and Plan    Urinary frequency - Ordered urine test. - Will treat accordingly or consider urology referral if persists  Acquired Hypothyroidism Hypothyroidism managed with 50 mcg levothyroxine . Previous TSH was 4.5. Thyroid  ultrasound showed a nodule not meeting criteria for repeat ultrasound. Discussed potential nodule removal if symptomatic. - Checked TSH levels. - Continue Synthroid , adjust dosage based on TSH results.  Vitamin D  deficiency Update vitamin D  and provide recommendations  Hyperlipidemia Update blood work and provide recommendations; she is fasting today  General Health Maintenance Up to date on mammogram. Declined pneumonia vaccination, considering next year.         Lucie Buttner, PA-C    [1]  Social History Tobacco Use   Smoking status: Never   Smokeless tobacco: Never  Vaping Use   Vaping status: Never Used  Substance Use Topics   Alcohol use: No    Comment: none   Drug use: No  [2]  Allergies Allergen  Reactions   Isovue [Iopamidol] Shortness Of Breath and Other (See Comments)    Pt states she had IV contrast 5 yrs ago and had SOB and her throat got very tight for about 30 secs.  We did w/o contrast on 05/16/17.  Pt needs full premeds in the future.  J Bohm, RTRCT   Ceftriaxone Tinitus   Iodinated Contrast Media   [3]  Current Outpatient Medications:    Bioflavonoid Products (VITAMIN C) CHEW, Chew 1 tablet by mouth daily., Disp: , Rfl:    Cyanocobalamin  (VITAMIN B-12 CR PO), Vitamin B12, Disp: , Rfl:    levothyroxine  (SYNTHROID ) 50 MCG tablet, Take 1 tablet by mouth once daily in the morning on an empty stomach, Disp: 90 tablet, Rfl: 1   Multiple Vitamin (MULTIVITAMIN) tablet, Take 1 tablet by mouth daily., Disp: , Rfl:    NON FORMULARY, Take 2 capsules by mouth daily. Marshmallow root, Disp: , Rfl:    VITAMIN D , CHOLECALCIFEROL, PO, Take by mouth., Disp: , Rfl:

## 2024-07-21 LAB — URINE CULTURE
MICRO NUMBER:: 17362190
SPECIMEN QUALITY:: ADEQUATE

## 2024-07-22 ENCOUNTER — Ambulatory Visit: Payer: Self-pay | Admitting: Physician Assistant

## 2024-08-21 ENCOUNTER — Other Ambulatory Visit: Payer: Self-pay | Admitting: Physician Assistant

## 2024-08-23 ENCOUNTER — Other Ambulatory Visit: Payer: Self-pay

## 2024-08-23 ENCOUNTER — Other Ambulatory Visit (HOSPITAL_BASED_OUTPATIENT_CLINIC_OR_DEPARTMENT_OTHER): Payer: Self-pay

## 2024-08-23 MED ORDER — LEVOTHYROXINE SODIUM 50 MCG PO TABS
50.0000 ug | ORAL_TABLET | Freq: Every morning | ORAL | 1 refills | Status: AC
  Filled 2024-08-23 (×2): qty 90, 90d supply, fill #0
